# Patient Record
Sex: Female | Born: 1940 | Race: White | Hispanic: No | Marital: Married | Smoking: Never smoker
Health system: Southern US, Community
[De-identification: ages and names within clinical notes are randomized; demographics above are authoritative.]

## PROBLEM LIST (undated history)

## (undated) DIAGNOSIS — M353 Polymyalgia rheumatica: Secondary | ICD-10-CM

## (undated) DIAGNOSIS — M8580 Other specified disorders of bone density and structure, unspecified site: Secondary | ICD-10-CM

## (undated) DIAGNOSIS — M79604 Pain in right leg: Secondary | ICD-10-CM

## (undated) DIAGNOSIS — N952 Postmenopausal atrophic vaginitis: Secondary | ICD-10-CM

## (undated) DIAGNOSIS — J301 Allergic rhinitis due to pollen: Secondary | ICD-10-CM

## (undated) DIAGNOSIS — M797 Fibromyalgia: Secondary | ICD-10-CM

## (undated) DIAGNOSIS — M1990 Unspecified osteoarthritis, unspecified site: Secondary | ICD-10-CM

## (undated) DIAGNOSIS — I10 Essential (primary) hypertension: Secondary | ICD-10-CM

## (undated) DIAGNOSIS — M79605 Pain in left leg: Secondary | ICD-10-CM

## (undated) DIAGNOSIS — E119 Type 2 diabetes mellitus without complications: Secondary | ICD-10-CM

## (undated) DIAGNOSIS — T7840XA Allergy, unspecified, initial encounter: Secondary | ICD-10-CM

## (undated) DIAGNOSIS — R079 Chest pain, unspecified: Secondary | ICD-10-CM

## (undated) DIAGNOSIS — E785 Hyperlipidemia, unspecified: Secondary | ICD-10-CM

## (undated) DIAGNOSIS — C4491 Basal cell carcinoma of skin, unspecified: Secondary | ICD-10-CM

## (undated) DIAGNOSIS — K219 Gastro-esophageal reflux disease without esophagitis: Secondary | ICD-10-CM

## (undated) HISTORY — DX: Chest pain, unspecified: R07.9

## (undated) HISTORY — PX: BREAST BIOPSY: SHX20

## (undated) HISTORY — DX: Allergy, unspecified, initial encounter: T78.40XA

## (undated) HISTORY — DX: Fibromyalgia: M79.7

## (undated) HISTORY — DX: Pain in right leg: M79.604

## (undated) HISTORY — DX: Allergic rhinitis due to pollen: J30.1

## (undated) HISTORY — DX: Basal cell carcinoma of skin, unspecified: C44.91

## (undated) HISTORY — DX: Unspecified osteoarthritis, unspecified site: M19.90

## (undated) HISTORY — DX: Type 2 diabetes mellitus without complications: E11.9

## (undated) HISTORY — DX: Other specified disorders of bone density and structure, unspecified site: M85.80

## (undated) HISTORY — DX: Essential (primary) hypertension: I10

## (undated) HISTORY — DX: Polymyalgia rheumatica: M35.3

## (undated) HISTORY — PX: TOTAL ABDOMINAL HYSTERECTOMY: SHX209

## (undated) HISTORY — DX: Pain in left leg: M79.605

## (undated) HISTORY — DX: Gastro-esophageal reflux disease without esophagitis: K21.9

## (undated) HISTORY — DX: Hyperlipidemia, unspecified: E78.5

## (undated) HISTORY — DX: Postmenopausal atrophic vaginitis: N95.2

---

## 1978-07-02 HISTORY — PX: CHOLECYSTECTOMY: SHX55

## 1998-07-02 DIAGNOSIS — M353 Polymyalgia rheumatica: Secondary | ICD-10-CM

## 1998-07-02 HISTORY — DX: Polymyalgia rheumatica: M35.3

## 2008-09-30 HISTORY — PX: COLONOSCOPY: SHX174

## 2011-08-03 DIAGNOSIS — M8580 Other specified disorders of bone density and structure, unspecified site: Secondary | ICD-10-CM

## 2011-08-03 HISTORY — DX: Other specified disorders of bone density and structure, unspecified site: M85.80

## 2011-12-01 HISTORY — PX: CARDIOVASCULAR STRESS TEST: SHX262

## 2011-12-01 HISTORY — PX: CARDIAC CATHETERIZATION: SHX172

## 2011-12-11 DIAGNOSIS — R079 Chest pain, unspecified: Secondary | ICD-10-CM

## 2011-12-18 ENCOUNTER — Encounter: Payer: Self-pay | Admitting: Cardiology

## 2011-12-19 ENCOUNTER — Encounter: Payer: Self-pay | Admitting: *Deleted

## 2011-12-19 ENCOUNTER — Ambulatory Visit (INDEPENDENT_AMBULATORY_CARE_PROVIDER_SITE_OTHER): Payer: PRIVATE HEALTH INSURANCE | Admitting: Physician Assistant

## 2011-12-19 ENCOUNTER — Encounter: Payer: Self-pay | Admitting: Physician Assistant

## 2011-12-19 ENCOUNTER — Telehealth: Payer: Self-pay

## 2011-12-19 VITALS — BP 104/64 | HR 74 | Ht 61.0 in | Wt 165.4 lb

## 2011-12-19 DIAGNOSIS — R079 Chest pain, unspecified: Secondary | ICD-10-CM

## 2011-12-19 DIAGNOSIS — Z01810 Encounter for preprocedural cardiovascular examination: Secondary | ICD-10-CM

## 2011-12-19 NOTE — Telephone Encounter (Signed)
Auth # [id number] exp 02/02/12

## 2011-12-19 NOTE — Telephone Encounter (Signed)
Left JV cath scheduled for Friday, 6/21 - 11:30 - [place]  CHECKING PERCERT

## 2011-12-19 NOTE — Patient Instructions (Signed)
   Left JV heart cath Friday - see info sheet given  Follow up will be given at time of discharge from above

## 2011-12-19 NOTE — Progress Notes (Signed)
HPI: Patient presents as a referral from Dr. Alton Revere, for evaluation of recurrent exertional chest pain, in the setting of a recent negative exercise nuclear imaging study. She presents with cardiac risk factors notable for HTN, HLD, DM, FH, and age.  Patient has no known history of CAD, and developed new onset exertional chest discomfort approximately one month ago, while doing yard work. This was a particularly intense (7/10) episode, characterized by left upper chest "tightness", with no associated radiation. Since then, she has had recurrent exertional symptoms, even while doing household chores. Symptoms typically resolve with rest after several minutes. She also has noted significant edema only when walking up a flight of stairs.  She was referred for a stress test, performed on June 11 and reviewed by Dr. Diona Browner, which was notable for poor exercise tolerance and no associated chest pain. She exercised for only approximately 3 minutes, with rapid increase in heart rate (102% PMHR). There were no diagnostic EKG changes, occasional PVCs with no sustained arrhythmia, and no perfusion defects suggestive of scar or ischemia; EF 89%.  Patient was seen by Dr. Darius Bump in followup earlier this week, at which time he recommended proceeding with a cardiac catheterization for further evaluation.  EKG in office today, reviewed by me, indicates NSR at 74 bpm with nonspecific ST abnormalities. There are no prior studies for comparison.  Allergies  Allergen Reactions  . Lyrica (Pregabalin)     Current Outpatient Prescriptions  Medication Sig Dispense Refill  . aspirin 81 MG tablet Take 81 mg by mouth daily.      Marland Kitchen CALCIUM PO Take 1,200 mg by mouth daily.       . cholecalciferol (VITAMIN D) 1000 UNITS tablet Take 1,000 Units by mouth daily.      . clopidogrel (PLAVIX) 75 MG tablet Take 75 mg by mouth daily.      Marland Kitchen glucose blood test strip 1 each by Other route as needed. Use as instructed       .  isosorbide mononitrate (IMDUR) 60 MG 24 hr tablet Take 60 mg by mouth daily.      Demetra Sh[employer] (ONE TOUCH SURESOFT) MISC Use when checking blood sugar      . metFORMIN (GLUCOPHAGE) 500 MG tablet Take 500 mg by mouth 2 (two) times daily with a meal. 1 in the morning and 2 in the evening       . metoprolol (LOPRESSOR) 50 MG tablet Take 50 mg by mouth 2 (two) times daily.      . nitroGLYCERIN (NITROSTAT) 0.4 MG SL tablet Place 0.4 mg under the tongue every 5 (five) minutes as needed.      . rosuvastatin (CRESTOR) 40 MG tablet Take 40 mg by mouth daily.      . valsartan-hydrochlorothiazide (DIOVAN-HCT) 160-12.5 MG per tablet Take 1 tablet by mouth daily. Take 1/2 tablet daily         Past Medical History  Diagnosis Date  . Chest pain   . Diabetes mellitus, type 2   . Hypertension   . Fibromyalgia   . Dyslipidemia     No past surgical history on file.  History   Social History  . Marital Status: Married    Spouse Name: N/A    Number of Children: N/A  . Years of Education: N/A   Occupational History  . Not on file.   Social History Main Topics  . Smoking status: Never Smoker   . Smokeless tobacco: Not on file  . Alcohol  Use: Yes     some in her past but not now.   . Drug Use: Not on file  . Sexually Active: Not on file   Other Topics Concern  . Not on file   Social History Narrative  . No narrative on file    No family history on file.  ROS: no nausea, vomiting; no fever, chills; no melena, hematochezia; no claudication  PHYSICAL EXAM: BP 104/64  Pulse 74  Ht 5\' 1"  (1.549 m)  Wt 165 lb 6.4 oz (75.025 kg)  BMI 31.25 kg/m2 GENERAL: 71 year old female, obese, sitting upright; NAD HEENT: NCAT, PERRLA, EOMI; sclera clear; no xanthelasma NECK: palpable bilateral carotid pulses, no bruits; no JVD; no TM LUNGS: CTA bilaterally CARDIAC: RRR (S1, S2); no significant murmurs; no rubs or gallops ABDOMEN: Protuberant EXTREMETIES: Palpable bilateral femoral pulses,  without bruits; intact distal pulses; trace bilateral peripheral edema SKIN: warm/dry; no obvious rash/lesions MUSCULOSKELETAL: no joint deformity NEURO: no focal deficit; NL affect   EKG: reviewed and available in Electronic Records   ASSESSMENT & PLAN:  Chest pain Following discussion with Dr. Myrtis Ser, recommendation is to proceed with diagnostic coronary angiography to exclude significant CAD. Patient presents with new-onset exertional chest discomfort, relieved with rest, in the context of numerous cardiac risk factors, including diabetes. Despite a recent negative exercise nuclear imaging study, characterized by poor exercise tolerance, but with normal perfusion, patient does present with symptoms worrisome for ischemic heart disease. She is in agreement with this recommendation, and the risks/benefits of the procedure were discussed in conjunction with Dr. Myrtis Ser. We will arrange to have this scheduled in our JV catheterization lab in the next 1-2 days. Patient is to continue on current medication regimen, which includes recent addition of Plavix and Imdur. She is also to use prn NTG, as recently instructed.    Gene Mercadez Heitman, PAC

## 2011-12-19 NOTE — Assessment & Plan Note (Signed)
Following discussion with Dr. Myrtis Ser, recommendation is to proceed with diagnostic coronary angiography to exclude significant CAD. Patient presents with new-onset exertional chest discomfort, relieved with rest, in the context of numerous cardiac risk factors, including diabetes. Despite a recent negative exercise nuclear imaging study, characterized by poor exercise tolerance, but with normal perfusion, patient does present with symptoms worrisome for ischemic heart disease. She is in agreement with this recommendation, and the risks/benefits of the procedure were discussed in conjunction with Dr. Myrtis Ser. We will arrange to have this scheduled in our JV catheterization lab in the next 1-2 days. Patient is to continue on current medication regimen, which includes recent addition of Plavix and Imdur. She is also to use prn NTG, as recently instructed.

## 2011-12-20 ENCOUNTER — Encounter (HOSPITAL_COMMUNITY): Payer: Self-pay

## 2011-12-20 ENCOUNTER — Ambulatory Visit (HOSPITAL_COMMUNITY): Admit: 2011-12-20 | Payer: Self-pay | Admitting: Cardiology

## 2011-12-20 SURGERY — LEFT HEART CATHETERIZATION WITH CORONARY ANGIOGRAM
Anesthesia: LOCAL

## 2011-12-21 ENCOUNTER — Inpatient Hospital Stay (HOSPITAL_BASED_OUTPATIENT_CLINIC_OR_DEPARTMENT_OTHER)
Admission: RE | Admit: 2011-12-21 | Discharge: 2011-12-21 | Disposition: A | Discharge status: Home / Self Care | Payer: PRIVATE HEALTH INSURANCE | Source: Ambulatory Visit | Attending: Cardiovascular Disease | Admitting: Cardiovascular Disease

## 2011-12-21 ENCOUNTER — Encounter (HOSPITAL_BASED_OUTPATIENT_CLINIC_OR_DEPARTMENT_OTHER)
Admission: RE | Disposition: A | Discharge status: Home / Self Care | Payer: Self-pay | Source: Ambulatory Visit | Attending: Cardiovascular Disease

## 2011-12-21 DIAGNOSIS — R0609 Other forms of dyspnea: Secondary | ICD-10-CM | POA: Insufficient documentation

## 2011-12-21 DIAGNOSIS — E119 Type 2 diabetes mellitus without complications: Secondary | ICD-10-CM | POA: Insufficient documentation

## 2011-12-21 DIAGNOSIS — IMO0001 Reserved for inherently not codable concepts without codable children: Secondary | ICD-10-CM | POA: Insufficient documentation

## 2011-12-21 DIAGNOSIS — E785 Hyperlipidemia, unspecified: Secondary | ICD-10-CM | POA: Insufficient documentation

## 2011-12-21 DIAGNOSIS — R079 Chest pain, unspecified: Secondary | ICD-10-CM | POA: Insufficient documentation

## 2011-12-21 DIAGNOSIS — I10 Essential (primary) hypertension: Secondary | ICD-10-CM | POA: Insufficient documentation

## 2011-12-21 DIAGNOSIS — R0989 Other specified symptoms and signs involving the circulatory and respiratory systems: Secondary | ICD-10-CM | POA: Insufficient documentation

## 2011-12-21 LAB — POCT I-STAT GLUCOSE: Glucose, Bld: 103 mg/dL — ABNORMAL HIGH (ref 70–99)

## 2011-12-21 SURGERY — JV LEFT HEART CATHETERIZATION WITH CORONARY ANGIOGRAM
Anesthesia: Moderate Sedation

## 2011-12-21 MED ORDER — SODIUM CHLORIDE 0.9 % IJ SOLN: 3.0000 mL | Freq: Two times a day (BID) | INTRAMUSCULAR | Status: DC

## 2011-12-21 MED ORDER — SODIUM CHLORIDE 0.9 % IV SOLN
INTRAVENOUS | Status: DC
  Administered 2011-12-21: 11:00:00 via INTRAVENOUS

## 2011-12-21 MED ORDER — SODIUM CHLORIDE 0.9 % IJ SOLN: 3.0000 mL | INTRAMUSCULAR | Status: DC | PRN

## 2011-12-21 MED ORDER — ONDANSETRON HCL 4 MG/2ML IJ SOLN: 4.0000 mg | Freq: Four times a day (QID) | INTRAMUSCULAR | Status: DC | PRN

## 2011-12-21 MED ORDER — ASPIRIN 81 MG PO CHEW
324.0000 mg | CHEWABLE_TABLET | ORAL | Status: AC
  Administered 2011-12-21: 324 mg via ORAL

## 2011-12-21 MED ORDER — ACETAMINOPHEN 325 MG PO TABS: 650.0000 mg | ORAL_TABLET | ORAL | Status: DC | PRN

## 2011-12-21 MED ORDER — DIAZEPAM 5 MG PO TABS
5.0000 mg | ORAL_TABLET | ORAL | Status: AC
  Administered 2011-12-21: 5 mg via ORAL

## 2011-12-21 MED ORDER — SODIUM CHLORIDE 0.9 % IV SOLN
250.0000 mL | INTRAVENOUS | Status: DC | PRN
Start: 2011-12-21 — End: 2011-12-21

## 2011-12-21 MED ORDER — SODIUM CHLORIDE 0.9 % IV SOLN: INTRAVENOUS | Status: DC

## 2011-12-21 NOTE — CV Procedure (Signed)
    Cardiac Cath Note  Meoshia Billing [id number] May 01, 1941  Procedure: Left  Heart Cardiac Catheterization Note Indications: chest pain, dyspnea   Procedure Details Consent: Obtained Time Out: Verified patient identification, verified procedure, site/side was marked, verified correct patient position, special equipment/implants available, Radiology Safety Procedures followed,  medications/allergies/relevent history reviewed, required imaging and test results available.  Performed   Medications: Fentanyl: 25 mcg IV Versed: 1.5 mg  IV  The right femoral artery was easily canulated using a modified Seldinger technique.  Hemodynamics:    LV pressure: 127/20 Aortic pressure: 117/87  Angiography   Left Main: The left main is smooth and normal.  Left anterior Descending: The left anterior descending artery is smooth and normal. It gives off 2 large diagonal branches which are also normal. The LAD tapers distally and comes in for a small branch but there are no stenosis.  Left Circumflex: The left circumflex artery is moderate in size. The slides a moderate-sized worse acute marginal branch. The continuation branch is extremely small. All these branches are normal.  Right Coronary Artery: The right coronary artery is moderate size and is dominant. It is smooth and normal. The posterior descending artery and the posterior lateral segment arteries are normal.  LV Gram: The left ventricular gram was performed in the 30 RAO position. It reveals normal left ventricle systolic function with an ejection fraction of 65-70%.  Complications: No apparent complications Patient did tolerate procedure well.  Conclusions:   1. Smooth and normal coronary arteries 2. Normal left ventricle systolic function.  Vesta Mixer, Montez Hageman., MD, [facility] 12/21/2011, 11:56 AM Office - [phone] Pager [phone]

## 2011-12-21 NOTE — Progress Notes (Signed)
Bedrest begins @ 1210, tegaderm dressing applied by Army Melia, Dr. Elease Hashimoto in to discuss results with patient and family.

## 2011-12-21 NOTE — Interval H&P Note (Signed)
History and Physical Interval Note:  12/21/2011 11:55 AM  Gabrielle Doyle  has presented today for surgery, with the diagnosis of chest pain  The various methods of treatment have been discussed with the patient and family. After consideration of risks, benefits and other options for treatment, the patient has consented to  Procedure(s) (LRB): JV LEFT HEART CATHETERIZATION WITH CORONARY ANGIOGRAM (N/A) as a surgical intervention .  The patient's history has been reviewed, patient examined, no change in status, stable for surgery.  I have reviewed the patients' chart and labs.  Questions were answered to the patient's satisfaction.     [name].

## 2011-12-21 NOTE — H&P (View-Only) (Signed)
HPI: Patient presents as a referral from Dr. Edward Eller, for evaluation of recurrent exertional chest pain, in the setting of a recent negative exercise nuclear imaging study. She presents with cardiac risk factors notable for HTN, HLD, DM, FH, and age.  Patient has no known history of CAD, and developed new onset exertional chest discomfort approximately one month ago, while doing yard work. This was a particularly intense (7/10) episode, characterized by left upper chest "tightness", with no associated radiation. Since then, she has had recurrent exertional symptoms, even while doing household chores. Symptoms typically resolve with rest after several minutes. She also has noted significant edema only when walking up a flight of stairs.  She was referred for a stress test, performed on June 11 and reviewed by Dr. McDowell, which was notable for poor exercise tolerance and no associated chest pain. She exercised for only approximately 3 minutes, with rapid increase in heart rate (102% PMHR). There were no diagnostic EKG changes, occasional PVCs with no sustained arrhythmia, and no perfusion defects suggestive of scar or ischemia; EF 89%.  Patient was seen by Dr. Eller in followup earlier this week, at which time he recommended proceeding with a cardiac catheterization for further evaluation.  EKG in office today, reviewed by me, indicates NSR at 74 bpm with nonspecific ST abnormalities. There are no prior studies for comparison.  Allergies  Allergen Reactions  . Lyrica (Pregabalin)     Current Outpatient Prescriptions  Medication Sig Dispense Refill  . aspirin 81 MG tablet Take 81 mg by mouth daily.      . CALCIUM PO Take 1,200 mg by mouth daily.       . cholecalciferol (VITAMIN D) 1000 UNITS tablet Take 1,000 Units by mouth daily.      . clopidogrel (PLAVIX) 75 MG tablet Take 75 mg by mouth daily.      . glucose blood test strip 1 each by Other route as needed. Use as instructed       .  isosorbide mononitrate (IMDUR) 60 MG 24 hr tablet Take 60 mg by mouth daily.      . [employer] (ONE TOUCH SURESOFT) MISC Use when checking blood sugar      . metFORMIN (GLUCOPHAGE) 500 MG tablet Take 500 mg by mouth 2 (two) times daily with a meal. 1 in the morning and 2 in the evening       . metoprolol (LOPRESSOR) 50 MG tablet Take 50 mg by mouth 2 (two) times daily.      . nitroGLYCERIN (NITROSTAT) 0.4 MG SL tablet Place 0.4 mg under the tongue every 5 (five) minutes as needed.      . rosuvastatin (CRESTOR) 40 MG tablet Take 40 mg by mouth daily.      . valsartan-hydrochlorothiazide (DIOVAN-HCT) 160-12.5 MG per tablet Take 1 tablet by mouth daily. Take 1/2 tablet daily         Past Medical History  Diagnosis Date  . Chest pain   . Diabetes mellitus, type 2   . Hypertension   . Fibromyalgia   . Dyslipidemia     No past surgical history on file.  History   Social History  . Marital Status: Married    Spouse Name: N/A    Number of Children: N/A  . Years of Education: N/A   Occupational History  . Not on file.   Social History Main Topics  . Smoking status: Never Smoker   . Smokeless tobacco: Not on file  . Alcohol   Use: Yes     some in her past but not now.   . Drug Use: Not on file  . Sexually Active: Not on file   Other Topics Concern  . Not on file   Social History Narrative  . No narrative on file    No family history on file.  ROS: no nausea, vomiting; no fever, chills; no melena, hematochezia; no claudication  PHYSICAL EXAM: BP 104/64  Pulse 74  Ht 5' 1" (1.549 m)  Wt 165 lb 6.4 oz (75.025 kg)  BMI 31.25 kg/m2 GENERAL: 71-year-old female, obese, sitting upright; NAD HEENT: NCAT, PERRLA, EOMI; sclera clear; no xanthelasma NECK: palpable bilateral carotid pulses, no bruits; no JVD; no TM LUNGS: CTA bilaterally CARDIAC: RRR (S1, S2); no significant murmurs; no rubs or gallops ABDOMEN: Protuberant EXTREMETIES: Palpable bilateral femoral pulses,  without bruits; intact distal pulses; trace bilateral peripheral edema SKIN: warm/dry; no obvious rash/lesions MUSCULOSKELETAL: no joint deformity NEURO: no focal deficit; NL affect   EKG: reviewed and available in Electronic Records   ASSESSMENT & PLAN:  Chest pain Following discussion with Dr. Katz, recommendation is to proceed with diagnostic coronary angiography to exclude significant CAD. Patient presents with new-onset exertional chest discomfort, relieved with rest, in the context of numerous cardiac risk factors, including diabetes. Despite a recent negative exercise nuclear imaging study, characterized by poor exercise tolerance, but with normal perfusion, patient does present with symptoms worrisome for ischemic heart disease. She is in agreement with this recommendation, and the risks/benefits of the procedure were discussed in conjunction with Dr. Katz. We will arrange to have this scheduled in our JV catheterization lab in the next 1-2 days. Patient is to continue on current medication regimen, which includes recent addition of Plavix and Imdur. She is also to use prn NTG, as recently instructed.    Gabrielle Doyle, PAC  

## 2011-12-31 DIAGNOSIS — C4491 Basal cell carcinoma of skin, unspecified: Secondary | ICD-10-CM

## 2011-12-31 HISTORY — DX: Basal cell carcinoma of skin, unspecified: C44.91

## 2012-01-10 ENCOUNTER — Encounter: Payer: PRIVATE HEALTH INSURANCE | Admitting: Physician Assistant

## 2012-04-07 ENCOUNTER — Encounter: Payer: Self-pay | Admitting: Family Medicine

## 2012-04-07 ENCOUNTER — Ambulatory Visit (INDEPENDENT_AMBULATORY_CARE_PROVIDER_SITE_OTHER): Payer: PRIVATE HEALTH INSURANCE | Admitting: Family Medicine

## 2012-04-07 VITALS — BP 118/71 | HR 78 | Temp 97.0°F | Ht 61.0 in | Wt 157.0 lb

## 2012-04-07 DIAGNOSIS — E785 Hyperlipidemia, unspecified: Secondary | ICD-10-CM

## 2012-04-07 DIAGNOSIS — Z23 Encounter for immunization: Secondary | ICD-10-CM

## 2012-04-07 DIAGNOSIS — I10 Essential (primary) hypertension: Secondary | ICD-10-CM | POA: Insufficient documentation

## 2012-04-07 DIAGNOSIS — E119 Type 2 diabetes mellitus without complications: Secondary | ICD-10-CM

## 2012-04-07 HISTORY — DX: Hyperlipidemia, unspecified: E78.5

## 2012-04-07 HISTORY — DX: Type 2 diabetes mellitus without complications: E11.9

## 2012-04-07 HISTORY — DX: Essential (primary) hypertension: I10

## 2012-04-07 LAB — COMPREHENSIVE METABOLIC PANEL
AST: 29 U/L (ref 0–37)
Albumin: 3.7 g/dL (ref 3.5–5.2)
Alkaline Phosphatase: 33 U/L — ABNORMAL LOW (ref 39–117)
BUN: 9 mg/dL (ref 6–23)
Glucose, Bld: 116 mg/dL — ABNORMAL HIGH (ref 70–99)
Potassium: 4 mEq/L (ref 3.5–5.1)
Sodium: 137 mEq/L (ref 135–145)
Total Bilirubin: 0.8 mg/dL (ref 0.3–1.2)
Total Protein: 7 g/dL (ref 6.0–8.3)

## 2012-04-07 MED ORDER — ZOSTER VACCINE LIVE 19400 UNT/0.65ML ~~LOC~~ SOLR: 0.6500 mL | Freq: Once | SUBCUTANEOUS | Status: DC

## 2012-04-07 NOTE — Assessment & Plan Note (Signed)
Problem stable.  Continue current medications and diet appropriate for this condition.  We have reviewed our general long term plan for this problem and also reviewed symptoms and signs that should prompt the patient to call or return to the office. CMET today. 

## 2012-04-07 NOTE — Assessment & Plan Note (Signed)
Problem stable.  Continue current medications and diet appropriate for this condition.  We have reviewed our general long term plan for this problem and also reviewed symptoms and signs that should prompt the patient to call or return to the office. HbA1c today. Pneumovax today.   Zostavax rx given to pt today.

## 2012-04-07 NOTE — Progress Notes (Signed)
Office Note 04/07/2012  CC:  Chief Complaint  Patient presents with  . Establish Care    no problems; transfer from Dr. Pam Drown    HPI:  Gabrielle Doyle is a 71 y.o. White female who is here to establish care. [name] most recent primary MD: Dr. Alton Revere, [place], [place]. Old records were not reviewed prior to or during today's visit.  Lives in [place] now, old MD went out of Network with her new insurance.  No complaints, feels well. Last lab work was about 4-6 mo ago and she doesn't recall exactly what was done at that time. All GYN f/u was done by her PCP: no hx of abnormal paps plus has hx of hysterectomy for nonmalignant reasons.  Bone density and mammogram both done this year and were normal.  Last diabetic eye exam 12/2011. Last colonoscopy was about 5 yrs ago--no abnormalities.  Flu shot was 04/02/12.  Tetanus booster approx 2011.  Past Medical History  Diagnosis Date  . Chest pain     cath clean 2013.  Presumed esophageal pain.  . Diabetes mellitus, type 2   . Hypertension   . Leg pain, bilateral     Questionable diagnosis of fibromyalgia in past (per pt); some vague leg pains, lyrica was tried and gave her side effect so this dx  consideration has really been dropped.  . Dyslipidemia   . Hay fever     seasonal--fall esp  . Osteoarthritis     hands, light--"legs" nonspecific--tylenol is sufficient for her.  . Polymyalgia rheumatica 2000    took prednisone for about 2 yrs.    Past Surgical History  Procedure Date  . Cardiac catheterization 12/2011    Smooth coronaries and normal LV fxn.  . Cholecystectomy 1980  . Total abdominal hysterectomy 1970s    non-malignant reasons; metromenorrhagia    Family History  Problem Relation Age of Onset  . Hyperlipidemia Mother   . Stroke Mother   . Heart disease Mother   . Hypertension Mother   . Diabetes Mother   . Hyperlipidemia Father   . Heart disease Father   . Hypertension Father   . Diabetes  Sister   . Diabetes Brother     History   Social History  . Marital Status: Married    Spouse Name: N/A    Number of Children: N/A  . Years of Education: N/A   Occupational History  . Not on file.   Social History Main Topics  . Smoking status: Never Smoker   . Smokeless tobacco: Never Used  . Alcohol Use: Yes     some in her past but not now.   . Drug Use: No  . Sexually Active: Not on file   Other Topics Concern  . Not on file   Social History Narrative   Married, 3 children living and around this region.Orig from [place], [place].Retired from [employer].  Level of education 12 grade.No T/A/Ds.Exercise: walks, works in yard.    Outpatient Encounter Prescriptions as of 04/07/2012  Medication Sig Dispense Refill  . aspirin 81 MG tablet Take 81 mg by mouth daily.      Marland Kitchen CALCIUM PO Take 1,200 mg by mouth daily.       . cholecalciferol (VITAMIN D) 1000 UNITS tablet Take 1,000 Units by mouth daily.      Marland Kitchen glucose blood test strip 1 each by Other route as needed. Use as instructed       . [employer] (ONE TOUCH SURESOFT) MISC  Use when checking blood sugar      . metFORMIN (GLUCOPHAGE) 500 MG tablet Take 500 mg by mouth 2 (two) times daily with a meal. 1 in the morning and 2 in the evening       . nitroGLYCERIN (NITROSTAT) 0.4 MG SL tablet Place 0.4 mg under the tongue every 5 (five) minutes as needed.      . rosuvastatin (CRESTOR) 40 MG tablet Take 40 mg by mouth daily.      . valsartan-hydrochlorothiazide (DIOVAN-HCT) 160-12.5 MG per tablet Take 1 tablet by mouth daily. Take 1/2 tablet daily       . zoster vaccine live, PF, (ZOSTAVAX) 04540 UNT/0.65ML injection Inject 19,400 Units into the skin once.  1 vial  0  . DISCONTD: clopidogrel (PLAVIX) 75 MG tablet Take 75 mg by mouth daily.      Marland Kitchen DISCONTD: isosorbide mononitrate (IMDUR) 60 MG 24 hr tablet Take 60 mg by mouth daily.      Marland Kitchen DISCONTD: metoprolol (LOPRESSOR) 50 MG tablet Take 50 mg by mouth 2 (two) times daily.         Allergies  Allergen Reactions  . Lyrica (Pregabalin) Other (See Comments)    Almost caused a stroke    ROS Review of Systems  Constitutional: Negative for fever and fatigue.  HENT: Negative for congestion and sore throat.   Eyes: Negative for visual disturbance.  Respiratory: Negative for cough.   Cardiovascular: Negative for chest pain.  Gastrointestinal: Negative for nausea and abdominal pain.  Genitourinary: Negative for dysuria.  Musculoskeletal: Negative for back pain and joint swelling.  Skin: Negative for rash.  Neurological: Negative for weakness and headaches.  Hematological: Negative for adenopathy.    PE; Blood pressure 118/71, pulse 78, temperature 97 F (36.1 C), temperature source Temporal, height 5\' 1"  (1.549 m), weight 157 lb (71.215 kg), SpO2 98.00%. Gen: Alert, well appearing.  Patient is oriented to person, place, time, and situation. ENT:   Eyes: no injection, icteris, swelling, or exudate.  EOMI, PERRLA. Nose: no drainage or turbinate edema/swelling.  No injection or focal lesion.  Mouth: lips without lesion/swelling.  Oral mucosa pink and moist.   Oropharynx without erythema, exudate, or swelling.  Neck - No masses or thyromegaly or limitation in range of motion CV: RRR, no m/r/g.   LUNGS: CTA bilat, nonlabored resps, good aeration in all lung fields. ABD: soft, NT, ND, BS normal.  No hepatospenomegaly or mass.  No bruits. EXT: no clubbing, cyanosis, or edema.  Labs: none today  ASSESSMENT AND PLAN:   New Pt: obtain old records.  HTN (hypertension), benign Problem stable.  Continue current medications and diet appropriate for this condition.  We have reviewed our general long term plan for this problem and also reviewed symptoms and signs that should prompt the patient to call or return to the office. CMET today.  Type II or unspecified type diabetes mellitus without mention of complication, not stated as uncontrolled Problem stable.  Continue  current medications and diet appropriate for this condition.  We have reviewed our general long term plan for this problem and also reviewed symptoms and signs that should prompt the patient to call or return to the office. HbA1c today. Pneumovax today.   Zostavax rx given to pt today.  Hyperlipidemia Problem stable.  Continue current medications and diet appropriate for this condition.  We have reviewed our general long term plan for this problem and also reviewed symptoms and signs that should prompt the patient to call  or return to the office.    An After Visit Summary was printed and given to the patient.  Return in about 4 months (around 08/08/2012) for f/u DM 2, hyperlip, HTN.

## 2012-04-07 NOTE — Assessment & Plan Note (Signed)
Problem stable.  Continue current medications and diet appropriate for this condition.  We have reviewed our general long term plan for this problem and also reviewed symptoms and signs that should prompt the patient to call or return to the office.  

## 2012-07-31 ENCOUNTER — Encounter: Payer: Self-pay | Admitting: Family Medicine

## 2012-08-05 ENCOUNTER — Encounter: Payer: Self-pay | Admitting: Family Medicine

## 2012-08-05 ENCOUNTER — Ambulatory Visit (INDEPENDENT_AMBULATORY_CARE_PROVIDER_SITE_OTHER): Payer: Medicare Other | Admitting: Family Medicine

## 2012-08-05 VITALS — BP 125/75 | HR 72 | Temp 97.6°F | Ht 61.0 in | Wt 161.0 lb

## 2012-08-05 DIAGNOSIS — N952 Postmenopausal atrophic vaginitis: Secondary | ICD-10-CM

## 2012-08-05 DIAGNOSIS — E119 Type 2 diabetes mellitus without complications: Secondary | ICD-10-CM

## 2012-08-05 DIAGNOSIS — E785 Hyperlipidemia, unspecified: Secondary | ICD-10-CM

## 2012-08-05 HISTORY — DX: Postmenopausal atrophic vaginitis: N95.2

## 2012-08-05 LAB — LIPID PANEL
Cholesterol: 131 mg/dL (ref 0–200)
LDL Cholesterol: 55 mg/dL (ref 0–99)
Total CHOL/HDL Ratio: 3
Triglycerides: 171 mg/dL — ABNORMAL HIGH (ref 0.0–149.0)
VLDL: 34.2 mg/dL (ref 0.0–40.0)

## 2012-08-05 MED ORDER — ESTROGENS, CONJUGATED 0.625 MG/GM VA CREA: TOPICAL_CREAM | Freq: Every day | VAGINAL | Status: DC

## 2012-08-05 MED ORDER — VALSARTAN-HYDROCHLOROTHIAZIDE 160-12.5 MG PO TABS: 0.5000 | ORAL_TABLET | Freq: Every day | ORAL | Status: DC

## 2012-08-05 MED ORDER — ROSUVASTATIN CALCIUM 40 MG PO TABS: 20.0000 mg | ORAL_TABLET | Freq: Every day | ORAL | Status: DC

## 2012-08-05 MED ORDER — METFORMIN HCL 500 MG PO TABS: ORAL_TABLET | ORAL | Status: DC

## 2012-08-05 MED ORDER — GLUCOSE BLOOD VI STRP: ORAL_STRIP | Status: DC

## 2012-08-05 NOTE — Progress Notes (Signed)
OFFICE NOTE  08/05/2012  CC:  Chief Complaint  Patient presents with  . Follow-up    DM, lipids, HTN    HPI: Patient is a 72 y.o. Caucasian female who is here for 4 mo f/u DM 2, hyperlipidemia, and HTN.   Feeling good except some chronic low back, hips, and leg pains that she wants to make a future appointment to address. Glucoses 90s-110 fasting.  Not checking any other time of day. No bp checks to report.   No med problems.  Reports vag sx's: describes irritated clitoris lately.  Never had this before.  Describes hx of blister ?  A few months ago she says this "popped"--serosanguinous type fluid came out per her description, has hurt some ever since and also has slight pulling/tearing sensation deep in vagina with intercourse.  Pertinent PMH:  Past Medical History  Diagnosis Date  . Chest pain     cath clean 2013.  Presumed esophageal pain.  . Diabetes mellitus, type 2   . Hypertension   . Leg pain, bilateral     Questionable diagnosis of fibromyalgia in past (per pt); some vague leg pains, lyrica was tried and gave her side effect so this dx  consideration has really been dropped.  . Dyslipidemia   . Hay fever     seasonal--fall esp  . Osteoarthritis     hands, light--"legs" nonspecific--tylenol is sufficient for her.  . Polymyalgia rheumatica 2000    took prednisone for about 2 yrs.  Marland Kitchen GERD (gastroesophageal reflux disease)   . Osteopenia 08/2011    T score avg 2  . Basal cell carcinoma 12/2011    Left shoulder    MEDS:  Outpatient Prescriptions Prior to Visit  Medication Sig Dispense Refill  . aspirin 81 MG tablet Take 81 mg by mouth daily.      Marland Kitchen CALCIUM PO Take 1,200 mg by mouth daily.       . cholecalciferol (VITAMIN D) 1000 UNITS tablet Take 2,000 Units by mouth daily.       Demetra Shiner Devices (ONE TOUCH SURESOFT) MISC Use when checking blood sugar      . nitroGLYCERIN (NITROSTAT) 0.4 MG SL tablet Place 0.4 mg under the tongue every 5 (five) minutes as needed.       . [DISCONTINUED] glucose blood test strip 1 each by Other route as needed. Use as instructed       . [DISCONTINUED] metFORMIN (GLUCOPHAGE) 500 MG tablet 1 in the morning and 2 in the evening      . [DISCONTINUED] rosuvastatin (CRESTOR) 40 MG tablet Take 20 mg by mouth daily.       . [DISCONTINUED] valsartan-hydrochlorothiazide (DIOVAN-HCT) 160-12.5 MG per tablet Take 0.5 tablets by mouth daily.       . [DISCONTINUED] zoster vaccine live, PF, (ZOSTAVAX) 40981 UNT/0.65ML injection Inject 19,400 Units into the skin once.  1 vial  0  Last reviewed on 08/05/2012  8:32 AM by Jeoffrey Massed, MD  PE: Blood pressure 125/75, pulse 72, temperature 97.6 F (36.4 C), temperature source Temporal, height 5\' 1"  (1.549 m), weight 161 lb (73.029 kg), SpO2 100.00%. Patient examined with CMA Francee Piccolo as chaperone. Gen: Alert, well appearing.  Patient is oriented to person, place, time, and situation. Vaginal exam: no focal mucosal lesions.  Generalized atrophy of vaginal mucosa is noted.  Urethral meatus appears normal.  No vaginal discharge.  No tenderness or swelling to suggest bartholin's gland cyst or abscess. No rash.  IMPRESSION AND PLAN:  Type II or unspecified type diabetes mellitus without mention of complication, not stated as uncontrolled Stable per home glucose reports. Check HbA1c today.  Needs annual foot exam next routine diabetic f/u visit.  Will also get her to local eye MD for diab retinopathy screening exam. Continue current med/dosing.  HTN (hypertension), benign Problem stable.  Continue current medications and diet appropriate for this condition.  We have reviewed our general long term plan for this problem and also reviewed symptoms and signs that should prompt the patient to call or return to the office. Lytes/cr normal 28mo ago. Will not check these today.  Hyperlipidemia Due for FLP today. Continue statin. AST/ALT normal 4 mo ago.  Postmenopause atrophic  vaginitis Start estrogen cream; apply vaginally daily.   An After Visit Summary was printed and given to the patient.  FOLLOW UP: 28mo

## 2012-08-05 NOTE — Assessment & Plan Note (Addendum)
Due for FLP today. Continue statin. AST/ALT normal 4 mo ago.

## 2012-08-05 NOTE — Assessment & Plan Note (Addendum)
Stable per home glucose reports. Check HbA1c today.  Needs annual foot exam next routine diabetic f/u visit.  Will also get her to local eye MD for diab retinopathy screening exam. Continue current med/dosing.

## 2012-08-05 NOTE — Assessment & Plan Note (Signed)
Start estrogen cream; apply vaginally daily.

## 2012-08-05 NOTE — Assessment & Plan Note (Signed)
Problem stable.  Continue current medications and diet appropriate for this condition.  We have reviewed our general long term plan for this problem and also reviewed symptoms and signs that should prompt the patient to call or return to the office. Lytes/cr normal 22mo ago. Will not check these today.

## 2012-12-03 ENCOUNTER — Ambulatory Visit: Payer: Medicare Other | Admitting: Family Medicine

## 2013-01-08 ENCOUNTER — Other Ambulatory Visit: Payer: Self-pay

## 2014-01-11 DIAGNOSIS — E1165 Type 2 diabetes mellitus with hyperglycemia: Secondary | ICD-10-CM | POA: Insufficient documentation

## 2014-01-11 DIAGNOSIS — E119 Type 2 diabetes mellitus without complications: Secondary | ICD-10-CM | POA: Insufficient documentation

## 2014-01-11 DIAGNOSIS — I499 Cardiac arrhythmia, unspecified: Secondary | ICD-10-CM | POA: Insufficient documentation

## 2014-07-13 DIAGNOSIS — Z23 Encounter for immunization: Secondary | ICD-10-CM | POA: Insufficient documentation

## 2014-07-14 DIAGNOSIS — N952 Postmenopausal atrophic vaginitis: Secondary | ICD-10-CM | POA: Insufficient documentation

## 2014-07-14 DIAGNOSIS — N816 Rectocele: Secondary | ICD-10-CM | POA: Insufficient documentation

## 2015-01-12 DIAGNOSIS — M25541 Pain in joints of right hand: Secondary | ICD-10-CM | POA: Insufficient documentation

## 2015-05-17 DIAGNOSIS — Z79899 Other long term (current) drug therapy: Secondary | ICD-10-CM | POA: Insufficient documentation

## 2015-05-17 DIAGNOSIS — R531 Weakness: Secondary | ICD-10-CM | POA: Insufficient documentation

## 2015-08-09 DIAGNOSIS — Z1231 Encounter for screening mammogram for malignant neoplasm of breast: Secondary | ICD-10-CM | POA: Insufficient documentation

## 2016-01-31 DIAGNOSIS — M25551 Pain in right hip: Secondary | ICD-10-CM | POA: Insufficient documentation

## 2016-01-31 DIAGNOSIS — G8929 Other chronic pain: Secondary | ICD-10-CM | POA: Insufficient documentation

## 2016-03-08 DIAGNOSIS — H2513 Age-related nuclear cataract, bilateral: Secondary | ICD-10-CM | POA: Insufficient documentation

## 2016-03-08 DIAGNOSIS — H524 Presbyopia: Secondary | ICD-10-CM | POA: Insufficient documentation

## 2016-03-08 DIAGNOSIS — H5203 Hypermetropia, bilateral: Secondary | ICD-10-CM | POA: Insufficient documentation

## 2016-03-08 DIAGNOSIS — H40033 Anatomical narrow angle, bilateral: Secondary | ICD-10-CM | POA: Insufficient documentation

## 2016-03-08 DIAGNOSIS — H02831 Dermatochalasis of right upper eyelid: Secondary | ICD-10-CM | POA: Insufficient documentation

## 2016-03-08 DIAGNOSIS — H52223 Regular astigmatism, bilateral: Secondary | ICD-10-CM | POA: Insufficient documentation

## 2016-07-09 DIAGNOSIS — R252 Cramp and spasm: Secondary | ICD-10-CM | POA: Insufficient documentation

## 2016-08-21 DIAGNOSIS — R42 Dizziness and giddiness: Secondary | ICD-10-CM | POA: Insufficient documentation

## 2019-11-11 ENCOUNTER — Telehealth: Payer: Self-pay

## 2019-11-11 NOTE — Telephone Encounter (Signed)
NOTES ON FILE FROM [facility] [phone], SENT REFERRAL TO SCHEDULING

## 2019-11-12 ENCOUNTER — Telehealth: Payer: Self-pay | Admitting: Interventional Cardiology

## 2019-11-12 NOTE — Telephone Encounter (Signed)
Left message to call back  

## 2019-11-12 NOTE — Telephone Encounter (Signed)
New message  Patient is wanting to get husband approved to accompany her to her appointment due to her SOB. Please give patient a call back to confirm.

## 2019-11-13 NOTE — Telephone Encounter (Signed)
Follow up  Pt is returning call, pt said if she did not able to answer the phone to leave her detailed message

## 2019-11-13 NOTE — Telephone Encounter (Signed)
I spoke to the patient and explained that we are still taking precautions with CoVid and not allowing accompanying family members with the patient, unless warranted.  She verbalized understanding.

## 2019-11-23 ENCOUNTER — Encounter: Payer: Self-pay | Admitting: Interventional Cardiology

## 2019-11-23 ENCOUNTER — Ambulatory Visit: Payer: Medicare HMO | Admitting: Interventional Cardiology

## 2019-11-23 ENCOUNTER — Other Ambulatory Visit: Payer: Self-pay

## 2019-11-23 VITALS — BP 124/60 | HR 80 | Ht 61.0 in | Wt 150.8 lb

## 2019-11-23 DIAGNOSIS — R0609 Other forms of dyspnea: Secondary | ICD-10-CM

## 2019-11-23 DIAGNOSIS — E782 Mixed hyperlipidemia: Secondary | ICD-10-CM | POA: Diagnosis not present

## 2019-11-23 DIAGNOSIS — R0600 Dyspnea, unspecified: Secondary | ICD-10-CM | POA: Diagnosis not present

## 2019-11-23 DIAGNOSIS — E119 Type 2 diabetes mellitus without complications: Secondary | ICD-10-CM | POA: Diagnosis not present

## 2019-11-23 DIAGNOSIS — R0789 Other chest pain: Secondary | ICD-10-CM | POA: Diagnosis not present

## 2019-11-23 MED ORDER — METOPROLOL TARTRATE 50 MG PO TABS: ORAL_TABLET | ORAL | 0 refills | Status: DC

## 2019-11-23 NOTE — Progress Notes (Signed)
Cardiology Office Note   Date:  11/23/2019   ID:  Gabrielle Doyle, DOB 1940/11/18, MRN [id number]  PCP:  Gabrielle Peers, Gabrielle Doyle    No chief complaint on file.  Shortness of breath  Wt Readings from Last 3 Encounters:  11/23/19 150 lb 12.8 oz (68.4 kg)  08/05/12 161 lb (73 kg)  04/07/12 157 lb (71.2 kg)       History of Present Illness: Gabrielle Doyle is a 79 y.o. female who is being seen today for the evaluation of DOE at the request of Gabrielle Doyle, Gabrielle Doyle, Gabrielle Doyle.  She had an episode of DOE when pushing a wheel barrow; the wheel was flat and she had to push extra hard.  There was some pressure in the chest but she was determined to get her tasks done.  She had to sit down immediately after.  Her husband felt that she did not look healthy.  SHe was Gabrielle Doyle and her husband "made her go to the doctor."  Since then, she has felt well.  She does walk about once a week on flat surfaces.  She does not walk up hills much.    I looked back in the record and she did have a stress test back in 2013.  She was not able to complete stage one of the Bruce protocol.  Cath was done in 2013 showing: "Left Main: The left main is smooth and normal.  Left anterior Descending: The left anterior descending artery is smooth and normal. It gives off 2 large diagonal branches which are also normal. The LAD tapers distally and comes in for a small branch but there are no stenosis.  Left Circumflex: The left circumflex artery is moderate in size. The slides a moderate-sized worse acute marginal branch. The continuation branch is extremely small. All these branches are normal.  Right Coronary Artery: The right coronary artery is moderate size and is dominant. It is smooth and normal. The posterior descending artery and the posterior lateral segment arteries are normal.  LV Gram: The left ventricular gram was performed in the 30 RAO position. It reveals normal left ventricle systolic function with an  ejection fraction of 65-70%.  Complications: No apparent complications Patient did tolerate procedure well.  Conclusions:   1. Smooth and normal coronary arteries 2. Normal left ventricle systolic function."  Denies :  Dizziness. Leg edema. Nitroglycerin use. Orthopnea. Palpitations. Paroxysmal nocturnal dyspnea.  Syncope.    Past Medical History:  Diagnosis Date  . Basal cell carcinoma 12/2011   Left shoulder  . Chest pain    cath clean 2013.  Presumed esophageal pain.  . Diabetes mellitus, type 2 (Gabrielle Doyle)   . Dyslipidemia   . GERD (gastroesophageal reflux disease)   . Hay fever    seasonal--fall esp  . HTN (hypertension), benign 04/07/2012  . Hyperlipidemia 04/07/2012  . Hypertension   . Leg pain, bilateral    Questionable diagnosis of fibromyalgia in past (per pt); some vague leg pains, lyrica was tried and gave her side effect so this dx  consideration has really been dropped.  . Osteoarthritis    hands, light--"legs" nonspecific--tylenol is sufficient for her.  . Osteopenia 08/2011   T score avg 2  . Polymyalgia rheumatica (Gabrielle Doyle) 2000   took prednisone for about 2 yrs.  Marland Kitchen Postmenopausal atrophic vaginitis   . Postmenopause atrophic vaginitis 08/05/2012  . Type II or unspecified type diabetes mellitus without mention of complication, not stated as uncontrolled 04/07/2012  Past Surgical History:  Procedure Laterality Date  . BREAST BIOPSY     x 2, left  . CARDIAC CATHETERIZATION  12/2011   Smooth coronaries and normal LV fxn.  Marland Kitchen CARDIOVASCULAR STRESS TEST  12/2011   No ischemia; hypertensive/tachycardic response.  Normal LV  syst fxn, 88%. EF.  Marland Kitchen CHOLECYSTECTOMY  1980  . COLONOSCOPY  09/2008   normal (Dr. Lindalou Hose, Gabrielle Doyle),  Repeat TCS 2020.   Marland Kitchen TOTAL ABDOMINAL HYSTERECTOMY  1970s   non-malignant reasons; metromenorrhagia     Current Outpatient Medications  Medication Sig Dispense Refill  . aspirin 81 MG tablet Take 81 mg by mouth daily.    Marland Kitchen CALCIUM PO Take 1,200 mg  by mouth daily.     . cholecalciferol (VITAMIN D) 1000 UNITS tablet Take 2,000 Units by mouth daily.     Marland Kitchen glucose blood (BAYER CONTOUR TEST) test strip Use as instructed. DX 250.00 100 each 5  . glucose blood test strip Dispense Ultilet Lancet.  Use as directed.  DX 250.00 100 each 5  . Lancet Devices (ONE TOUCH SURESOFT) MISC Use when checking blood sugar    . losartan (COZAAR) 50 MG tablet Take 50 mg by mouth daily.    . metFORMIN (GLUCOPHAGE) 500 MG tablet 1 in the morning and 2 in the evening 90 tablet 5  . Multiple Vitamin (MULTIVITAMIN) capsule Take by mouth.    . nitroGLYCERIN (NITROSTAT) 0.4 MG SL tablet Place 0.4 mg under the tongue every 5 (five) minutes as needed.    . rosuvastatin (CRESTOR) 40 MG tablet Take 0.5 tablets (20 mg total) by mouth daily. 30 tablet 5  . metoprolol tartrate (LOPRESSOR) 50 MG tablet Take 1 tablet 2 hours prior to Cardiac CT 1 tablet 0   No current facility-administered medications for this visit.    Allergies:   Celecoxib, Lisinopril, and Lyrica [pregabalin]    Social History:  The patient  reports that she has never smoked. She has never used smokeless tobacco. She reports current alcohol use. She reports that she does not use drugs.   Family History:  The patient's family history includes Diabetes in her brother, mother, and sister; Heart disease in her father and mother; Hyperlipidemia in her father and mother; Hypertension in her father and mother; Stroke in her mother.    ROS:  Please see the history of present illness.   Otherwise, review of systems are positive for episode of dyspnea as noted above.   All other systems are reviewed and negative.    PHYSICAL EXAM: VS:  BP 124/60   Pulse 80   Ht 5\' 1"  (1.549 Doyle)   Wt 150 lb 12.8 oz (68.4 kg)   SpO2 95%   BMI 28.49 kg/Doyle  , BMI Body mass index is 28.49 kg/Doyle. GEN: Well nourished, well developed, in no acute distress  HEENT: normal  Neck: no JVD, carotid bruits, or masses Cardiac: RRR; no  murmurs, rubs, or gallops,no edema  Respiratory:  clear to auscultation bilaterally, normal work of breathing GI: soft, nontender, nondistended, + BS MS: no deformity or atrophy  Skin: warm and dry, no rash Neuro:  Strength and sensation are intact Psych: euthymic mood, full affect   EKG:   The ekg ordered today demonstrates normal sinus rhythm, no ST segment changes   Recent Labs: No results found for requested labs within last 8760 hours.   Lipid Panel    Component Value Date/Time   CHOL 131 08/05/2012 0857   TRIG 171.0 (H) 08/05/2012  [id number]   HDL 41.50 08/05/2012 0857   CHOLHDL 3 08/05/2012 0857   VLDL 34.2 08/05/2012 0857   LDLCALC 55 08/05/2012 0857     Other studies Reviewed: Additional studies/ records that were reviewed today with results demonstrating: Abnormal ECG noted in primary care doctor's note, but I am unable to pull up the tracing.   ASSESSMENT AND PLAN:  1. DOE/chest pressure: Plan for coronary CTA to evaluate for significant coronary artery disease.  She has a below average exercise tolerance as of 2013.  Based on her level of activity at this time, I doubt this is improved.  Given her multiple risk factors including diabetes, hypertension and hyperlipidemia, I think she does need an evaluation. 2. Hyperlipidemia: Continue high-dose rosuvastatin. 3. Diabetes: Continue Glucophage. 4. Hypertension: Well controlled today.  She previously had been on valsartan/HCTZ but it appears this has been held.   Current medicines are reviewed at length with the patient today.  The patient concerns regarding her medicines were addressed.  The following changes have been made:  No change  Labs/ tests ordered today include:   Orders Placed This Encounter  Procedures  . CT CORONARY MORPH W/CTA COR W/SCORE W/CA W/CM &/OR WO/CM  . CT CORONARY FRACTIONAL FLOW RESERVE DATA PREP  . CT CORONARY FRACTIONAL FLOW RESERVE FLUID ANALYSIS  . Basic metabolic panel  . EKG 12-Lead      Recommend 150 minutes/week of aerobic exercise Low fat, low carb, high fiber diet recommended  Disposition:   FU for cardiac CT   Signed, Larae Grooms, Gabrielle Doyle  11/23/2019 4:20 PM    [facility] [address], [place], [place]  [address] Phone: [phone]; Fax: [phone]

## 2019-11-23 NOTE — Patient Instructions (Addendum)
Medication Instructions:  Your physician recommends that you continue on your current medications as directed. Please refer to the Current Medication list given to you today.  *If you need a refill on your cardiac medications before your next appointment, please call your pharmacy*   Lab Work: None today  If you have labs (blood work) drawn today and your tests are completely normal, you will receive your results only by: Marland Kitchen MyChart Message (if you have MyChart) OR . A paper copy in the mail If you have any lab test that is abnormal or we need to change your treatment, we will call you to review the results.   Testing/Procedures: Your physician has requested that you have cardiac CT. Cardiac computed tomography (CT) is a painless test that uses an x-ray machine to take clear, detailed pictures of your heart. For further information please visit [email or web address][facility][email or web address]. Please follow instruction sheet as given.   Follow-Up: Based on results  Your cardiac CT will be scheduled at one of the below locations:   [facility] [address] [place], [place] [address] [phone]  [place] [address] [place], [place] [address] [phone]  If scheduled at [facility], please arrive at the [facility] main entrance of [facility] 30 minutes prior to test start time. Proceed to the [facility] Radiology Department (first floor) to check-in and test prep.  If scheduled at [facility], please arrive 15 mins early for check-in and test prep.  Please follow these instructions carefully (unless otherwise directed):   On the Night Before the Test: . Be sure to Drink plenty of water. . Do not consume any caffeinated/decaffeinated beverages or chocolate 12 hours prior to your test. . Do not take any antihistamines 12 hours prior to your test. . If you take Metformin do not take 24  hours prior to test.   On the Day of the Test: . Drink plenty of water. Do not drink any water within one hour of the test. . Do not eat any food 4 hours prior to the test. . You may take your regular medications prior to the test.  . Take metoprolol (Lopressor) 50 MG two hours prior to test. . HOLD metformin morning of the test. . FEMALES- please wear underwire-free bra if available       After the Test: . Drink plenty of water. . After receiving IV contrast, you may experience a mild flushed feeling. This is normal. . On occasion, you may experience a mild rash up to 24 hours after the test. This is not dangerous. If this occurs, you can take Benadryl 25 mg and increase your fluid intake. . If you experience trouble breathing, this can be serious. If it is severe call 911 IMMEDIATELY. If it is mild, please call our office. Marland Kitchen HOLD metformin for 48 hours after the procedure.   Once we have confirmed authorization from your insurance company, we will call you to set up a date and time for your test.   For non-scheduling related questions, please contact the cardiac imaging nurse navigator should you have any questions/concerns: Marchia Bond, Cardiac Imaging Nurse Navigator Burley Saver, Interim Cardiac Imaging Nurse [place] [facility] Direct Office Dial: [phone]   For scheduling needs, including cancellations and rescheduling, please call [phone].

## 2019-12-24 ENCOUNTER — Telehealth: Payer: Self-pay | Admitting: Interventional Cardiology

## 2019-12-24 NOTE — Telephone Encounter (Signed)
Follow up   Gabrielle Doyle from [facility] calling back to follow up. She said since there request is urgent appeal process is time sensitive

## 2019-12-24 NOTE — Telephone Encounter (Signed)
Gabrielle Doyle from [facility] calling stating they received an appeal for the patients CT. She states they did not get any office notes or records and would like to confirm what records can be sent.

## 2019-12-25 NOTE — Telephone Encounter (Signed)
Gabrielle Doyle from [facility] is following up on request for medical records.

## 2020-01-21 ENCOUNTER — Other Ambulatory Visit: Payer: Self-pay | Admitting: Internal Medicine

## 2020-01-27 ENCOUNTER — Other Ambulatory Visit: Payer: Self-pay | Admitting: Interventional Cardiology

## 2020-01-27 LAB — BASIC METABOLIC PANEL
BUN/Creatinine Ratio: 17 (ref 12–28)
BUN: 9 mg/dL (ref 8–27)
CO2: 28 mmol/L (ref 20–29)
Calcium: 9.2 mg/dL (ref 8.7–10.3)
Chloride: 105 mmol/L (ref 96–106)
Creatinine, Ser: 0.53 mg/dL — ABNORMAL LOW (ref 0.57–1.00)
GFR calc Af Amer: 106 mL/min/{1.73_m2} (ref >59)
GFR calc non Af Amer: 92 mL/min/{1.73_m2} (ref >59)
Glucose: 127 mg/dL — ABNORMAL HIGH (ref 65–99)
Potassium: 4.7 mmol/L (ref 3.5–5.2)
Sodium: 140 mmol/L (ref 134–144)

## 2020-02-04 ENCOUNTER — Telehealth (HOSPITAL_COMMUNITY): Payer: Self-pay | Admitting: *Deleted

## 2020-02-04 NOTE — Telephone Encounter (Signed)

## 2020-02-05 ENCOUNTER — Ambulatory Visit (HOSPITAL_COMMUNITY)
Admission: RE | Admit: 2020-02-05 | Discharge: 2020-02-05 | Disposition: A | Discharge status: Home / Self Care | Payer: Medicare HMO | Source: Ambulatory Visit | Attending: Interventional Cardiology | Admitting: Interventional Cardiology

## 2020-02-05 DIAGNOSIS — R0600 Dyspnea, unspecified: Secondary | ICD-10-CM | POA: Insufficient documentation

## 2020-02-05 DIAGNOSIS — R0609 Other forms of dyspnea: Secondary | ICD-10-CM

## 2020-02-05 DIAGNOSIS — I700 Atherosclerosis of aorta: Secondary | ICD-10-CM | POA: Insufficient documentation

## 2020-02-05 MED ORDER — NITROGLYCERIN 0.4 MG SL SUBL
SUBLINGUAL_TABLET | SUBLINGUAL | Status: AC
  Filled 2020-02-05: qty 2

## 2020-02-05 MED ORDER — SODIUM CHLORIDE 0.9% FLUSH: 10.0000 mL | Freq: Two times a day (BID) | INTRAVENOUS | Status: DC

## 2020-02-05 MED ORDER — SODIUM CHLORIDE 0.9% FLUSH: 10.0000 mL | INTRAVENOUS | Status: DC | PRN

## 2020-02-05 MED ORDER — NITROGLYCERIN 0.4 MG SL SUBL
0.8000 mg | SUBLINGUAL_TABLET | Freq: Once | SUBLINGUAL | Status: AC
  Administered 2020-02-05: 0.8 mg via SUBLINGUAL

## 2020-02-05 MED ORDER — IOHEXOL 350 MG/ML SOLN
80.0000 mL | Freq: Once | INTRAVENOUS | Status: AC | PRN
  Administered 2020-02-05: 80 mL via INTRAVENOUS

## 2021-06-22 DIAGNOSIS — Z1231 Encounter for screening mammogram for malignant neoplasm of breast: Secondary | ICD-10-CM | POA: Diagnosis not present

## 2021-08-23 DIAGNOSIS — H2513 Age-related nuclear cataract, bilateral: Secondary | ICD-10-CM | POA: Diagnosis not present

## 2021-08-23 DIAGNOSIS — H04123 Dry eye syndrome of bilateral lacrimal glands: Secondary | ICD-10-CM | POA: Diagnosis not present

## 2021-08-23 DIAGNOSIS — Z7984 Long term (current) use of oral hypoglycemic drugs: Secondary | ICD-10-CM | POA: Diagnosis not present

## 2021-08-23 DIAGNOSIS — H40053 Ocular hypertension, bilateral: Secondary | ICD-10-CM | POA: Diagnosis not present

## 2021-08-23 DIAGNOSIS — H5203 Hypermetropia, bilateral: Secondary | ICD-10-CM | POA: Diagnosis not present

## 2021-08-23 DIAGNOSIS — H40033 Anatomical narrow angle, bilateral: Secondary | ICD-10-CM | POA: Diagnosis not present

## 2021-08-23 DIAGNOSIS — H25013 Cortical age-related cataract, bilateral: Secondary | ICD-10-CM | POA: Diagnosis not present

## 2021-08-23 DIAGNOSIS — H35361 Drusen (degenerative) of macula, right eye: Secondary | ICD-10-CM | POA: Diagnosis not present

## 2021-08-23 DIAGNOSIS — H52202 Unspecified astigmatism, left eye: Secondary | ICD-10-CM | POA: Diagnosis not present

## 2021-08-23 DIAGNOSIS — H524 Presbyopia: Secondary | ICD-10-CM | POA: Diagnosis not present

## 2021-08-23 DIAGNOSIS — E119 Type 2 diabetes mellitus without complications: Secondary | ICD-10-CM | POA: Diagnosis not present

## 2022-01-05 DIAGNOSIS — H40053 Ocular hypertension, bilateral: Secondary | ICD-10-CM | POA: Diagnosis not present

## 2022-01-05 DIAGNOSIS — H40033 Anatomical narrow angle, bilateral: Secondary | ICD-10-CM | POA: Diagnosis not present

## 2022-01-05 DIAGNOSIS — H25813 Combined forms of age-related cataract, bilateral: Secondary | ICD-10-CM | POA: Diagnosis not present

## 2022-03-12 DIAGNOSIS — E785 Hyperlipidemia, unspecified: Secondary | ICD-10-CM | POA: Diagnosis not present

## 2022-03-12 DIAGNOSIS — I10 Essential (primary) hypertension: Secondary | ICD-10-CM | POA: Diagnosis not present

## 2022-03-12 DIAGNOSIS — Z79899 Other long term (current) drug therapy: Secondary | ICD-10-CM | POA: Diagnosis not present

## 2022-03-12 DIAGNOSIS — Z1211 Encounter for screening for malignant neoplasm of colon: Secondary | ICD-10-CM | POA: Diagnosis not present

## 2022-03-12 DIAGNOSIS — E1165 Type 2 diabetes mellitus with hyperglycemia: Secondary | ICD-10-CM | POA: Diagnosis not present

## 2022-03-12 DIAGNOSIS — Z23 Encounter for immunization: Secondary | ICD-10-CM | POA: Diagnosis not present

## 2022-05-02 ENCOUNTER — Encounter (HOSPITAL_BASED_OUTPATIENT_CLINIC_OR_DEPARTMENT_OTHER): Payer: Self-pay | Admitting: Emergency Medicine

## 2022-05-02 ENCOUNTER — Emergency Department (HOSPITAL_BASED_OUTPATIENT_CLINIC_OR_DEPARTMENT_OTHER): Payer: Medicare HMO

## 2022-05-02 ENCOUNTER — Other Ambulatory Visit: Payer: Self-pay

## 2022-05-02 ENCOUNTER — Emergency Department (HOSPITAL_BASED_OUTPATIENT_CLINIC_OR_DEPARTMENT_OTHER)
Admission: EM | Admit: 2022-05-02 | Discharge: 2022-05-02 | Disposition: A | Discharge status: Home / Self Care | Payer: Medicare HMO | Attending: Emergency Medicine | Admitting: Emergency Medicine

## 2022-05-02 DIAGNOSIS — E119 Type 2 diabetes mellitus without complications: Secondary | ICD-10-CM | POA: Diagnosis not present

## 2022-05-02 DIAGNOSIS — R0602 Shortness of breath: Secondary | ICD-10-CM | POA: Insufficient documentation

## 2022-05-02 DIAGNOSIS — R079 Chest pain, unspecified: Secondary | ICD-10-CM | POA: Diagnosis not present

## 2022-05-02 DIAGNOSIS — R0789 Other chest pain: Secondary | ICD-10-CM | POA: Diagnosis not present

## 2022-05-02 DIAGNOSIS — R000 Tachycardia, unspecified: Secondary | ICD-10-CM | POA: Diagnosis not present

## 2022-05-02 DIAGNOSIS — Z79899 Other long term (current) drug therapy: Secondary | ICD-10-CM | POA: Diagnosis not present

## 2022-05-02 DIAGNOSIS — Q278 Other specified congenital malformations of peripheral vascular system: Secondary | ICD-10-CM | POA: Diagnosis not present

## 2022-05-02 DIAGNOSIS — I10 Essential (primary) hypertension: Secondary | ICD-10-CM | POA: Diagnosis not present

## 2022-05-02 DIAGNOSIS — Z7982 Long term (current) use of aspirin: Secondary | ICD-10-CM | POA: Insufficient documentation

## 2022-05-02 DIAGNOSIS — Z7984 Long term (current) use of oral hypoglycemic drugs: Secondary | ICD-10-CM | POA: Insufficient documentation

## 2022-05-02 DIAGNOSIS — K5730 Diverticulosis of large intestine without perforation or abscess without bleeding: Secondary | ICD-10-CM | POA: Diagnosis not present

## 2022-05-02 DIAGNOSIS — Z9049 Acquired absence of other specified parts of digestive tract: Secondary | ICD-10-CM | POA: Diagnosis not present

## 2022-05-02 DIAGNOSIS — M7989 Other specified soft tissue disorders: Secondary | ICD-10-CM | POA: Insufficient documentation

## 2022-05-02 DIAGNOSIS — I2510 Atherosclerotic heart disease of native coronary artery without angina pectoris: Secondary | ICD-10-CM | POA: Diagnosis not present

## 2022-05-02 DIAGNOSIS — I700 Atherosclerosis of aorta: Secondary | ICD-10-CM | POA: Diagnosis not present

## 2022-05-02 LAB — CBC
HCT: 40.6 % (ref 36.0–46.0)
Hemoglobin: 13.6 g/dL (ref 12.0–15.0)
MCH: 30.8 pg (ref 26.0–34.0)
MCHC: 33.5 g/dL (ref 30.0–36.0)
MCV: 92.1 fL (ref 80.0–100.0)
Platelets: 167 10*3/uL (ref 150–400)
RBC: 4.41 MIL/uL (ref 3.87–5.11)
RDW: 13 % (ref 11.5–15.5)
WBC: 10.6 10*3/uL — ABNORMAL HIGH (ref 4.0–10.5)
nRBC: 0 % (ref 0.0–0.2)

## 2022-05-02 LAB — HEPATIC FUNCTION PANEL
ALT: 17 U/L (ref 0–44)
AST: 24 U/L (ref 15–41)
Albumin: 3.9 g/dL (ref 3.5–5.0)
Alkaline Phosphatase: 38 U/L (ref 38–126)
Bilirubin, Direct: 0.1 mg/dL (ref 0.0–0.2)
Indirect Bilirubin: 0.9 mg/dL (ref 0.3–0.9)
Total Bilirubin: 1 mg/dL (ref 0.3–1.2)
Total Protein: 7.6 g/dL (ref 6.5–8.1)

## 2022-05-02 LAB — BRAIN NATRIURETIC PEPTIDE: B Natriuretic Peptide: 74.5 pg/mL (ref 0.0–100.0)

## 2022-05-02 LAB — BASIC METABOLIC PANEL
Anion gap: 9 (ref 5–15)
BUN: 9 mg/dL (ref 8–23)
CO2: 27 mmol/L (ref 22–32)
Calcium: 9.2 mg/dL (ref 8.9–10.3)
Chloride: 97 mmol/L — ABNORMAL LOW (ref 98–111)
Creatinine, Ser: 0.63 mg/dL (ref 0.44–1.00)
GFR, Estimated: >60 mL/min (ref >60)
Glucose, Bld: 155 mg/dL — ABNORMAL HIGH (ref 70–99)
Potassium: 4 mmol/L (ref 3.5–5.1)
Sodium: 133 mmol/L — ABNORMAL LOW (ref 135–145)

## 2022-05-02 LAB — TROPONIN I (HIGH SENSITIVITY)
Troponin I (High Sensitivity): 3 ng/L (ref <18)
Troponin I (High Sensitivity): 4 ng/L (ref <18)

## 2022-05-02 LAB — LIPASE, BLOOD: Lipase: 31 U/L (ref 11–51)

## 2022-05-02 MED ORDER — PANTOPRAZOLE SODIUM 40 MG IV SOLR
40.0000 mg | Freq: Once | INTRAVENOUS | Status: AC
  Administered 2022-05-02: 40 mg via INTRAVENOUS
  Filled 2022-05-02: qty 10

## 2022-05-02 MED ORDER — MORPHINE SULFATE (PF) 4 MG/ML IV SOLN
4.0000 mg | Freq: Once | INTRAVENOUS | Status: AC
  Administered 2022-05-02: 4 mg via INTRAVENOUS
  Filled 2022-05-02: qty 1

## 2022-05-02 MED ORDER — OMEPRAZOLE 20 MG PO CPDR: 20.0000 mg | DELAYED_RELEASE_CAPSULE | Freq: Every day | ORAL | 0 refills | Status: DC

## 2022-05-02 MED ORDER — IOHEXOL 350 MG/ML SOLN
100.0000 mL | Freq: Once | INTRAVENOUS | Status: AC | PRN
  Administered 2022-05-02: 100 mL via INTRAVENOUS

## 2022-05-02 MED ORDER — HYDROCODONE-ACETAMINOPHEN 5-325 MG PO TABS: 1.0000 | ORAL_TABLET | Freq: Four times a day (QID) | ORAL | 0 refills | Status: DC | PRN

## 2022-05-02 NOTE — Discharge Instructions (Addendum)
1.  Start taking your omeprazole tomorrow morning.  Take daily about 30 minutes before you eat in the morning. 2.  Review instructions and information regarding gastroesophageal reflux disease.  At this time, your heart labs and CT scan did not show evidence of a heart attack or other emergent condition at this time.  Try treating gastroesophageal reflux symptoms and see if your pain is improving. 3.  You may take either extra strength Tylenol every 6 hours as needed or a Vicodin tablet if you need a stronger pain medication for the next couple of days.  If your symptoms are due to reflux, your pain should be improving over the next 2 to 5 days.  You must follow-up with your family doctor.  You may need referral to gastroenterology for an upper endoscopy for further evaluation. 4.  If you take the Vicodin tablets, they may cause constipation.  Take an over-the-counter stool softener such as Colace twice daily to prevent constipation. 5.  Return to emergency department immediately if you get new, worsening or concerning symptoms such as unusual shortness of breath, sudden change in your pain, confusion weakness or other concerning changes.

## 2022-05-02 NOTE — ED Notes (Signed)
Patient transported to X-ray 

## 2022-05-02 NOTE — ED Notes (Signed)
Chest pain began yesterday and remains consistent, describes as aching feeling, points to area at mid chest non radiating. States she has had some shortness of breath. Denies any strenuous activity with onset of chest pain. Tried some OTC antacids and tried tylenol with very little relief. Noted to have edema in ankles bilaterally.

## 2022-05-02 NOTE — ED Provider Notes (Signed)
[facility] DEPARTMENT Provider Note   CSN: [id number] Arrival date & time: 05/02/22  1632     History  Chief Complaint  Patient presents with   Chest Pain    Gabrielle Doyle is a 81 y.o. female.  HPI Patient reports he started having chest pain 2 days ago.  Has been constant.  Central chest and slightly to the left.  Deep aching quality.  Patient reports slight shortness of breath.  Family has noticed that more than the patient herself.  She reports she chronically has some swelling in her legs.  It does not seem to be more than usual.  No pain in the legs.  But she gets nighttime cramps.  Denies abdominal pain.  She denies radiation into her back.  She denies vomiting.  Denies fevers or cough.  Patient reports sometimes she takes some Tums type reflux medication but does not take any routinely and has not typically been treated for reflux in the past.    Home Medications Prior to Admission medications   Medication Sig Start Date End Date Taking? Authorizing Provider  HYDROcodone-acetaminophen (NORCO/VICODIN) 5-325 MG tablet Take 1 tablet by mouth every 6 (six) hours as needed for moderate pain or severe pain. 05/02/22  Yes Charlesetta Shanks, MD  omeprazole (PRILOSEC) 20 MG capsule Take 1 capsule (20 mg total) by mouth daily. 05/02/22  Yes Charlesetta Shanks, MD  aspirin 81 MG tablet Take 81 mg by mouth daily.    [provider]  CALCIUM PO Take 1,200 mg by mouth daily.     [provider]  cholecalciferol (VITAMIN D) 1000 UNITS tablet Take 2,000 Units by mouth daily.     [provider]  glucose blood (BAYER CONTOUR TEST) test strip Use as instructed. DX 250.00 08/05/12   McGowen, Adrian Blackwater, MD  glucose blood test strip Dispense Ultilet Lancet.  Use as directed.  DX 250.00 08/05/12   McGowen, Adrian Blackwater, MD  Lancet Devices (ONE [employer] SURESOFT) MISC Use when checking blood sugar 12/18/11   [provider]  losartan (COZAAR) 50 MG tablet  Take 50 mg by mouth daily. 10/26/19   [provider]  metFORMIN (GLUCOPHAGE) 500 MG tablet 1 in the morning and 2 in the evening 08/05/12   McGowen, Adrian Blackwater, MD  metoprolol tartrate (LOPRESSOR) 50 MG tablet Take 1 tablet 2 hours prior to Cardiac CT 11/23/19   Jettie Booze, MD  Multiple Vitamin (MULTIVITAMIN) capsule Take by mouth.    [provider]  nitroGLYCERIN (NITROSTAT) 0.4 MG SL tablet Place 0.4 mg under the tongue every 5 (five) minutes as needed.    [provider]  rosuvastatin (CRESTOR) 40 MG tablet Take 0.5 tablets (20 mg total) by mouth daily. 08/05/12   McGowen, Adrian Blackwater, MD      Allergies    Celecoxib, Lisinopril, and Lyrica [pregabalin]    Review of Systems   Review of Systems  Physical Exam Updated Vital Signs BP 133/71   Pulse 100   Temp 99.2 F (37.3 C) (Oral)   Resp 20   Ht '5\' 1"'$  (1.549 m)   Wt 68.4 kg   SpO2 100%   BMI 28.47 kg/m  Physical Exam Constitutional:      Comments: Alert nontoxic clinically well in appearance  HENT:     Mouth/Throat:     Pharynx: Oropharynx is clear.  Eyes:     Extraocular Movements: Extraocular movements intact.  Cardiovascular:     Rate and Rhythm:  Normal rate and regular rhythm.  Pulmonary:     Effort: Pulmonary effort is normal.     Breath sounds: Normal breath sounds.     Comments: Minimally tender chest wall to compression on the left.  Sternal chest wall. Abdominal:     General: There is no distension.     Palpations: Abdomen is soft.     Tenderness: There is no abdominal tenderness. There is no guarding.  Musculoskeletal:        General: Normal range of motion.     Cervical back: Neck supple.     Comments: Calf soft nontender.  No significant peripheral edema.  Feet warm and dry.  Distal pulses 2+ and symmetric.  Skin:    General: Skin is warm.  Neurological:     General: No focal deficit present.     Mental Status: She is oriented to person, place, and time.     Motor: No  weakness.     Coordination: Coordination normal.  Psychiatric:        Mood and Affect: Mood normal.     ED Results / Procedures / Treatments   Labs (all labs ordered are listed, but only abnormal results are displayed) Labs Reviewed  BASIC METABOLIC PANEL - Abnormal; Notable for the following components:      Result Value   Sodium 133 (*)    Chloride 97 (*)    Glucose, Bld 155 (*)    All other components within normal limits  CBC - Abnormal; Notable for the following components:   WBC 10.6 (*)    All other components within normal limits  BRAIN NATRIURETIC PEPTIDE  LIPASE, BLOOD  HEPATIC FUNCTION PANEL  TROPONIN I (HIGH SENSITIVITY)  TROPONIN I (HIGH SENSITIVITY)    EKG EKG Interpretation  Date/Time:  Wednesday May 02 2022 16:43:55 EDT Ventricular Rate:  103 PR Interval:  150 QRS Duration: 82 QT Interval:  356 QTC Calculation: 466 R Axis:   30 Text Interpretation: Sinus tachycardia Otherwise normal ECG No previous ECGs available inferior q wave, otherwise normal, no old comparison Confirmed by Charlesetta Shanks [phone]) on 05/02/2022 5:33:39 PM  Radiology CT Angio Chest/Abd/Pel for Dissection W and/or W/WO  Result Date: 05/02/2022 CLINICAL DATA:  Acute aortic syndrome suspected. EXAM: CT ANGIOGRAPHY CHEST, ABDOMEN AND PELVIS TECHNIQUE: Non-contrast CT of the chest was initially obtained. Multidetector CT imaging through the chest, abdomen and pelvis was performed using the standard protocol during bolus administration of intravenous contrast. Multiplanar reconstructed images and MIPs were obtained and reviewed to evaluate the vascular anatomy. RADIATION DOSE REDUCTION: This exam was performed according to the departmental dose-optimization program which includes automated exposure control, adjustment of the mA and/or kV according to patient size and/or use of iterative reconstruction technique. CONTRAST:  165m OMNIPAQUE IOHEXOL 350 MG/ML SOLN COMPARISON:  None Available.  FINDINGS: CTA CHEST FINDINGS Cardiovascular: Preferential opacification of the thoracic aorta. No evidence of thoracic aortic aneurysm or dissection. Normal heart size. No pericardial effusion. There are atherosclerotic calcifications of the aorta. Aberrant right subclavian artery is present, normal variation. Mediastinum/Nodes: No enlarged mediastinal, hilar, or axillary lymph nodes. Thyroid gland, trachea, and esophagus demonstrate no significant findings. Lungs/Pleura: Lungs are clear. No pleural effusion or pneumothorax. Musculoskeletal: No chest wall abnormality. No acute or significant osseous findings. Review of the MIP images confirms the above findings. CTA ABDOMEN AND PELVIS FINDINGS VASCULAR Aorta: Normal caliber aorta without aneurysm, dissection, vasculitis or significant stenosis. There are atherosclerotic calcifications of the aorta. Celiac: Patent without evidence of  aneurysm, dissection, vasculitis or significant stenosis. SMA: Patent without evidence of aneurysm, dissection, vasculitis or significant stenosis. Renals: Both renal arteries are patent without evidence of aneurysm, dissection, vasculitis, fibromuscular dysplasia or significant stenosis. Atherosclerotic calcifications are present. IMA: Patent without evidence of aneurysm, dissection, vasculitis or significant stenosis. Inflow: Patent without evidence of aneurysm, dissection, vasculitis or significant stenosis. Veins: No obvious venous abnormality within the limitations of this arterial phase study. Review of the MIP images confirms the above findings. NON-VASCULAR Hepatobiliary: No focal liver abnormality is seen. Status post cholecystectomy. No biliary dilatation. Pancreas: Unremarkable. No pancreatic ductal dilatation or surrounding inflammatory changes. Spleen: Normal in size without focal abnormality. Adrenals/Urinary Tract: Adrenal glands are unremarkable. Kidneys are normal, without renal calculi, focal lesion, or hydronephrosis.  Left renal cortical scarring present. Bladder is unremarkable. Stomach/Bowel: Stomach is within normal limits. No evidence of bowel wall thickening, distention, or inflammatory changes. Appendix is not seen. There is sigmoid colon diverticulosis. Lymphatic: No enlarged lymph nodes are seen. Reproductive: Status post hysterectomy. No adnexal masses. Other: No abdominal wall hernia or abnormality. No abdominopelvic ascites. Musculoskeletal: No acute or significant osseous findings. Review of the MIP images confirms the above findings. IMPRESSION: 1. No evidence for aortic dissection or aneurysm. 2. No acute cardiopulmonary process. 3. No acute process in the abdomen or pelvis. 4. Sigmoid colon diverticulosis. 5.  Aortic Atherosclerosis (ICD10-I70.0). Electronically Signed   By: Ronney Asters M.D.   On: 05/02/2022 19:10   DG Chest 2 View  Result Date: 05/02/2022 CLINICAL DATA:  Chest pain. EXAM: CHEST - 2 VIEW COMPARISON:  Nov 05, 2019. FINDINGS: The heart size and mediastinal contours are within normal limits. Both lungs are clear. The visualized skeletal structures are unremarkable. IMPRESSION: No active cardiopulmonary disease. Electronically Signed   By: Marijo Conception M.D.   On: 05/02/2022 17:34    Procedures Procedures    Medications Ordered in ED Medications  pantoprazole (PROTONIX) injection 40 mg (40 mg Intravenous Given 05/02/22 1836)  morphine (PF) 4 MG/ML injection 4 mg (4 mg Intravenous Given 05/02/22 1835)  iohexol (OMNIPAQUE) 350 MG/ML injection 100 mL (100 mLs Intravenous Contrast Given 05/02/22 1850)    ED Course/ Medical Decision Making/ A&P                           Medical Decision Making Amount and/or Complexity of Data Reviewed Labs: ordered. Radiology: ordered.  Risk Prescription drug management.   Patient presents as outlined with about 2 days of continuous central mid chest pain.  Patient has risk factors of advanced age, dyslipidemia, hypertension, type 2 diabetes.   We will proceed with diagnostic evaluation for ACS\differential includes PE, GERD or esophagitis, pancreatitis, pneumonia, aortic dissection.  [name] blood pressures are stable.  She is not significantly hypertensive at this time.  Will opt to treat with a dose of Protonix and morphine.  EKG reviewed by myself does not show acute ischemic pattern present.  No significant change from previous tracings.  2 troponins are negative.  Lipase normal.  CT dissection study reviewed by radiology and also personally visualized and reviewed by myself, no appearance of acute dissection or other emergent findings.  I have reassessed the patient.  She has gotten improved pain relief with the combination of Protonix and morphine.  Her mental status is clear.  She does not show any signs of somnolence or sedation.  Vital signs remained stable.  This time have extensively reviewed the diagnostic results and  home management plan with the patient and her 2 family members at bedside.  They voiced understanding and discharge instructions include complete review of addition of daily omeprazole, use of either acetaminophen or Vicodin for additional pain control for the next couple of days, GERD home management instructions and follow-up recommendations.  Return precautions have been reviewed with the patient and family members at bedside for any concerning changes in her condition.        Final Clinical Impression(s) / ED Diagnoses Final diagnoses:  Nonspecific chest pain    Rx / DC Orders ED Discharge Orders          Ordered    omeprazole (PRILOSEC) 20 MG capsule  Daily        05/02/22 1953    HYDROcodone-acetaminophen (NORCO/VICODIN) 5-325 MG tablet  Every 6 hours PRN        05/02/22 1953              Charlesetta Shanks, MD 05/02/22 2002

## 2022-05-02 NOTE — ED Triage Notes (Signed)
Mid chest sharp pain x 1 day , SOB with exertion . Bilateral ankle edema .  No obvious resp distress.

## 2022-05-02 NOTE — ED Notes (Signed)
Patient transported to CT 

## 2022-05-03 ENCOUNTER — Ambulatory Visit (INDEPENDENT_AMBULATORY_CARE_PROVIDER_SITE_OTHER): Payer: Medicare HMO | Admitting: Gastroenterology

## 2022-05-03 ENCOUNTER — Encounter: Payer: Self-pay | Admitting: Gastroenterology

## 2022-05-03 VITALS — BP 100/58 | HR 88 | Ht 59.5 in | Wt 145.0 lb

## 2022-05-03 DIAGNOSIS — R0789 Other chest pain: Secondary | ICD-10-CM | POA: Diagnosis not present

## 2022-05-03 DIAGNOSIS — K219 Gastro-esophageal reflux disease without esophagitis: Secondary | ICD-10-CM

## 2022-05-03 DIAGNOSIS — Z1211 Encounter for screening for malignant neoplasm of colon: Secondary | ICD-10-CM

## 2022-05-03 DIAGNOSIS — R152 Fecal urgency: Secondary | ICD-10-CM | POA: Diagnosis not present

## 2022-05-03 NOTE — Progress Notes (Signed)
HPI : Gabrielle Doyle is a very pleasant 81 year old female with a history of DM, PMR and HTN who is referred to [place] by Dr. Rolan Lipa for colon cancer screening.  The patient reports having a colonoscopy 10 or so years ago, thinks it was in [place].  She knows that it was normal and she did not have polyps. She is not sure if she has had more than one colonoscopy.   Her imported surgical history indicates a normal colonoscopy performed in 2010 by Dr. Lindalou Hose with recommendations to repeat in 2020. She denies any family history of colon cancer and denies concerning symptoms such as blood in stool or unintentional weight loss. She does have occasional problems with fecal urgency and incontinence with loose stools.  This tends to be more of a problem when she is going out in public, particularly when eating out.  This has been a long standing problem and is not new or progressive.  No abdominal pain.  Constipation rarely a problem.  She went to the ED yesterday with chest pain which was new for her.  The pain was described as an intense pressure sensation in her chest which lasted all day and night.  It was not described as a burning sensation, but she did report a 'fullness' in her chest.  No acid taste in mouth.  In the ED her troponins and EKG were unremarkable.  CT-A was also unremarkable.  She was given PPI, antacids and morphine and the pain resolved.  She was given a 30-day supply of omeprazole.  She does not typically have heartburn, chest pain or acid regurgitation, but does have chronic phlegm and a throat clearing cough every morning, which had previously been attributed to PND, but may be GERD.  No symptoms or hoarseness or sore throat. No dysphagia.  No nausea/vomiting.  No nocturnal cough symptoms.  She is fairly active at home, but does not do any exercise.  She does do yard work and chores around the house.  She denies any chest pain with activity or shortness of breath.  No  light-headedness/dizziness/presyncope.     Past Medical History:  Diagnosis Date   Basal cell carcinoma 12/2011   Left shoulder   Chest pain    cath clean 2013.  Presumed esophageal pain.   Diabetes mellitus, type 2 (HCC)    Dyslipidemia    Fibromyalgia    GERD (gastroesophageal reflux disease)    Hay fever    seasonal--fall esp   HTN (hypertension), benign 04/07/2012   Hyperlipidemia 04/07/2012   Hypertension    Leg pain, bilateral    Questionable diagnosis of fibromyalgia in past (per pt); some vague leg pains, lyrica was tried and gave her side effect so this dx  consideration has really been dropped.   Osteoarthritis    hands, light--"legs" nonspecific--tylenol is sufficient for her.   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   CARDIOVASCULAR STRESS TEST  12/2011   No ischemia; hypertensive/tachycardic response.  Normal LV  syst  fxn, 88%. EF.   CHOLECYSTECTOMY  1980   COLONOSCOPY  09/2008   normal (Dr. Lindalou Hose, MD),  Repeat TCS 2020.    TOTAL ABDOMINAL HYSTERECTOMY  1970s   non-malignant reasons; metromenorrhagia   Family History  Problem Relation Age of Onset   Hyperlipidemia Mother    Stroke Mother    Heart disease Mother    Hypertension Mother    Diabetes Mother    Hyperlipidemia Father    Heart disease Father    Hypertension Father    Diabetes Sister    Diabetes Brother    Heart attack Brother    Diabetes Daughter    Hypertension Daughter    Hypertension Daughter    Social History   Tobacco Use   Smoking status: Never    Smokeless tobacco: Never  Vaping Use   Vaping Use: Never used  Substance Use Topics   Alcohol use: Yes    Comment: fireball in the morning   Drug use: No   Current Outpatient Medications  Medication Sig Dispense Refill   aspirin 81 MG tablet Take 81 mg by mouth daily.     CALCIUM PO Take 1,200 mg by mouth daily.      cholecalciferol (VITAMIN D) 1000 UNITS tablet Take 2,000 Units by mouth daily.      glucose blood (BAYER CONTOUR TEST) test strip Use as instructed. DX 250.00 100 each 5   glucose blood test strip Dispense Ultilet Lancet.  Use as directed.  DX 250.00 100 each 5   HYDROcodone-acetaminophen (NORCO/VICODIN) 5-325 MG tablet Take 1 tablet by mouth every 6 (six) hours as needed for moderate pain or severe pain. 10 tablet 0   Lancet Devices (ONE TOUCH SURESOFT) MISC Use when checking blood sugar     losartan (COZAAR) 50 MG tablet Take 50 mg by mouth daily.     metFORMIN (GLUCOPHAGE) 500 MG tablet 1 in the morning and 2 in the evening 90 tablet 5   Multiple Vitamin (MULTIVITAMIN) capsule Take by mouth.     omeprazole (PRILOSEC) 20 MG capsule Take 1 capsule (20 mg total) by mouth daily. 30 capsule 0   rosuvastatin (CRESTOR) 40 MG tablet Take 0.5 tablets (20 mg total) by mouth daily. 30 tablet 5   nitroGLYCERIN (NITROSTAT) 0.4 MG SL tablet Place 0.4 mg under the tongue every 5 (five) minutes as needed. (Patient not taking: Reported on 05/03/2022)     No current facility-administered medications for this visit.   Allergies  Allergen Reactions   Celebrex [Celecoxib] Nausea And Vomiting    "Almost had a stroke"; syncope, slept all the time "Almost had a stroke"; syncope, slept all the time    Lisinopril Nausea And Vomiting   Lyrica [Pregabalin] Other (See Comments)    Hyponatremia     Review of Systems: All systems reviewed and negative except where noted in HPI.    CT Angio Chest/Abd/Pel for Dissection W and/or W/WO  Result Date: 05/02/2022 CLINICAL DATA:  Acute aortic  syndrome suspected. EXAM: CT ANGIOGRAPHY CHEST, ABDOMEN AND PELVIS TECHNIQUE: Non-contrast CT of the chest was initially obtained. Multidetector CT imaging through the chest, abdomen and pelvis was performed using the standard protocol during bolus administration of intravenous contrast. Multiplanar reconstructed images and MIPs were obtained and reviewed to evaluate the vascular anatomy. RADIATION DOSE REDUCTION: This exam was performed according to the departmental dose-optimization program which includes automated exposure control, adjustment of the mA and/or kV according to patient size and/or use of iterative reconstruction technique. CONTRAST:  111m OMNIPAQUE IOHEXOL 350 MG/ML SOLN COMPARISON:  None Available. FINDINGS: CTA CHEST FINDINGS Cardiovascular: Preferential opacification of the thoracic aorta. No evidence of thoracic aortic aneurysm or dissection. Normal heart size. No pericardial effusion. There are atherosclerotic calcifications of the aorta. Aberrant right subclavian artery is present, normal variation. Mediastinum/Nodes: No enlarged mediastinal, hilar, or axillary lymph nodes. Thyroid gland, trachea, and esophagus demonstrate no significant findings. Lungs/Pleura: Lungs are clear. No pleural effusion or pneumothorax. Musculoskeletal: No chest wall abnormality. No acute or significant osseous findings. Review of the MIP images confirms the above findings. CTA ABDOMEN AND PELVIS FINDINGS VASCULAR Aorta: Normal caliber aorta without aneurysm, dissection, vasculitis or significant stenosis. There are atherosclerotic calcifications of the aorta. Celiac: Patent without evidence of aneurysm, dissection, vasculitis or significant stenosis. SMA: Patent without evidence of aneurysm, dissection, vasculitis or significant stenosis. Renals: Both renal arteries are patent without evidence of aneurysm, dissection, vasculitis, fibromuscular dysplasia or significant stenosis. Atherosclerotic calcifications are  present. IMA: Patent without evidence of aneurysm, dissection, vasculitis or significant stenosis. Inflow: Patent without evidence of aneurysm, dissection, vasculitis or significant stenosis. Veins: No obvious venous abnormality within the limitations of this arterial phase study. Review of the MIP images confirms the above findings. NON-VASCULAR Hepatobiliary: No focal liver abnormality is seen. Status post cholecystectomy. No biliary dilatation. Pancreas: Unremarkable. No pancreatic ductal dilatation or surrounding inflammatory changes. Spleen: Normal in size without focal abnormality. Adrenals/Urinary Tract: Adrenal glands are unremarkable. Kidneys are normal, without renal calculi, focal lesion, or hydronephrosis. Left renal cortical scarring present. Bladder is unremarkable. Stomach/Bowel: Stomach is within normal limits. No evidence of bowel wall thickening, distention, or inflammatory changes. Appendix is not seen. There is sigmoid colon diverticulosis. Lymphatic: No enlarged lymph nodes are seen. Reproductive: Status post hysterectomy. No adnexal masses. Other: No abdominal wall hernia or abnormality. No abdominopelvic ascites. Musculoskeletal: No acute or significant osseous findings. Review of the MIP images confirms the above findings. IMPRESSION: 1. No evidence for aortic dissection or aneurysm. 2. No acute cardiopulmonary process. 3. No acute process in the abdomen or pelvis. 4. Sigmoid colon diverticulosis. 5.  Aortic Atherosclerosis (ICD10-I70.0). Electronically Signed   By: ARonney AstersM.D.   On: 05/02/2022 19:10   DG Chest 2 View  Result Date: 05/02/2022 CLINICAL DATA:  Chest pain. EXAM: CHEST - 2 VIEW COMPARISON:  Nov 05, 2019. FINDINGS: The heart size and mediastinal contours are within normal limits. Both lungs are clear. The visualized skeletal structures are unremarkable. IMPRESSION: No active cardiopulmonary disease. Electronically Signed   By: JMarijo ConceptionM.D.   On: 05/02/2022 17:34     Physical Exam: BP (!) 100/58 (BP Location: Left Arm, Patient Position: Sitting, Cuff Size: Normal)   Pulse 88 Comment: irregular  Ht 4' 11.5" (1.511 m) Comment: height measured without shoes  Wt 145 lb (65.8 kg)   BMI 28.80 kg/m  Constitutional: Pleasant,well-developed, Caucasian female in no acute distress.  Accompanied by daughter.  Patient able to rise from seated position with hand assist HEENT: Normocephalic and atraumatic. Conjunctivae are normal. No scleral icterus. Neck supple.  Cardiovascular: Normal rate, regular rhythm.   Systolic murmur at RUSB (3/6) Pulmonary/chest: Effort normal and breath sounds normal. No wheezing, rales or rhonchi. Abdominal: Soft, nondistended, nontender. Bowel sounds active throughout. There are no masses palpable. No hepatomegaly. Extremities: B/l LE edema Neurological: Alert and oriented to person place and time. Skin: Skin is warm and dry. No rashes noted. Psychiatric: Normal mood and affect. Behavior is normal.  CBC    Component  Value Date/Time   WBC 10.6 (H) 05/02/2022 1711   RBC 4.41 05/02/2022 1711   HGB 13.6 05/02/2022 1711   HCT 40.6 05/02/2022 1711   PLT 167 05/02/2022 1711   MCV 92.1 05/02/2022 1711   MCH 30.8 05/02/2022 1711   MCHC 33.5 05/02/2022 1711   RDW 13.0 05/02/2022 1711    CMP     Component Value Date/Time   NA 133 (L) 05/02/2022 1711   NA 140 01/27/2020 0900   K 4.0 05/02/2022 1711   CL 97 (L) 05/02/2022 1711   CO2 27 05/02/2022 1711   GLUCOSE 155 (H) 05/02/2022 1711   BUN 9 05/02/2022 1711   BUN 9 01/27/2020 0900   CREATININE 0.63 05/02/2022 1711   CALCIUM 9.2 05/02/2022 1711   PROT 7.6 05/02/2022 1711   ALBUMIN 3.9 05/02/2022 1711   AST 24 05/02/2022 1711   ALT 17 05/02/2022 1711   ALKPHOS 38 05/02/2022 1711   BILITOT 1.0 05/02/2022 1711   GFRNONAA >60 05/02/2022 1711   GFRAA 106 01/27/2020 0900     ASSESSMENT AND PLAN: 81 year old female with no lower GI symptoms or red flag symptoms, no family  history of colon cancer, referred for screening colonoscopy. She reportedly had a normal colonoscopy in 2010 by Dr. Lindalou Hose (report not available).  She has no known cardiopulmonary comorbidities.  Her chest pain episode does seem more likely to be GERD related rather than cardiac.  Her functional status is fairly good but her mobility seemed fairly limited on my brief interaction with her. We discussed how colon cancer screening after age 75 is considered on a case by case basis, taking into account the patient's overall health and life expectancy, as well as their risk factors for colon cancer (history of polyps, family history).  Overall, it may be reasonable to conclude she has a 10 year life expectancy.  We discussed doing no further colon cancer screening, doing a colonoscopy or doing a stool-based screening test.  After this discussion, the patient and her daughter both opted for a stool-based test.  Will order Cologuard.  If positive, will proceed with colonoscopy in the [place].  If negative, I would recommend against any further colon cancer screening. For her upper GI symptoms, I would recommend continuing the omeprazole for a month to see if this improves her morning phlegm and throat irritation.   For her occasional fecal urgency/incontinence, I recommended she start taking a daily fiber supplement such as Metamucil to bulk up her stool.  Colon cancer screening - Cologuard  Fecal urgency - Daily Metamucil  Morning phlegm/throat irritation - Continue omeprazole daily for a month to see if improves.  Gabrielle Doyle E. Candis Schatz, MD [facility]  CC:  Robyne Peers, MD

## 2022-05-03 NOTE — Patient Instructions (Signed)
_______________________________________________________  If you are age 79 or older, your body mass index should be between 23-30. Your Body mass index is 28.8 kg/m. If this is out of the aforementioned range listed, please consider follow up with your Primary Care Provider.  If you are age 90 or younger, your body mass index should be between 19-25. Your Body mass index is 28.8 kg/m. If this is out of the aformentioned range listed, please consider follow up with your Primary Care Provider.   Start Metamucil daily.  Take Prilosec daily for a month.  The  GI providers would like to encourage you to use [facility] to communicate with providers for non-urgent requests or questions.  Due to long hold times on the telephone, sending your provider a message by [facility] may be a faster and more efficient way to get a response.  Please allow 48 business hours for a response.  Please remember that this is for non-urgent requests.   It was a pleasure to see you today!  Thank you for trusting me with your gastrointestinal care!    Scott E.Candis Schatz, MD

## 2022-05-07 DIAGNOSIS — R0789 Other chest pain: Secondary | ICD-10-CM | POA: Diagnosis not present

## 2022-05-07 DIAGNOSIS — K219 Gastro-esophageal reflux disease without esophagitis: Secondary | ICD-10-CM | POA: Diagnosis not present

## 2022-05-07 DIAGNOSIS — M545 Low back pain, unspecified: Secondary | ICD-10-CM | POA: Diagnosis not present

## 2022-05-07 DIAGNOSIS — R1013 Epigastric pain: Secondary | ICD-10-CM | POA: Diagnosis not present

## 2022-05-07 DIAGNOSIS — G8929 Other chronic pain: Secondary | ICD-10-CM | POA: Diagnosis not present

## 2022-05-10 DIAGNOSIS — Z7984 Long term (current) use of oral hypoglycemic drugs: Secondary | ICD-10-CM | POA: Diagnosis not present

## 2022-05-10 DIAGNOSIS — H25013 Cortical age-related cataract, bilateral: Secondary | ICD-10-CM | POA: Diagnosis not present

## 2022-05-10 DIAGNOSIS — H40053 Ocular hypertension, bilateral: Secondary | ICD-10-CM | POA: Diagnosis not present

## 2022-05-10 DIAGNOSIS — H04123 Dry eye syndrome of bilateral lacrimal glands: Secondary | ICD-10-CM | POA: Diagnosis not present

## 2022-05-10 DIAGNOSIS — H40033 Anatomical narrow angle, bilateral: Secondary | ICD-10-CM | POA: Diagnosis not present

## 2022-05-10 DIAGNOSIS — H2513 Age-related nuclear cataract, bilateral: Secondary | ICD-10-CM | POA: Diagnosis not present

## 2022-05-10 DIAGNOSIS — E119 Type 2 diabetes mellitus without complications: Secondary | ICD-10-CM | POA: Diagnosis not present

## 2022-05-10 DIAGNOSIS — H35361 Drusen (degenerative) of macula, right eye: Secondary | ICD-10-CM | POA: Diagnosis not present

## 2022-05-11 DIAGNOSIS — Z1211 Encounter for screening for malignant neoplasm of colon: Secondary | ICD-10-CM | POA: Diagnosis not present

## 2022-05-19 LAB — COLOGUARD: COLOGUARD: POSITIVE — AB

## 2022-05-22 ENCOUNTER — Encounter: Payer: Self-pay | Admitting: Nurse Practitioner

## 2022-05-22 ENCOUNTER — Telehealth: Payer: Self-pay | Admitting: Gastroenterology

## 2022-05-22 NOTE — Progress Notes (Signed)
Ms. Weinheimer,  Your Cologuard test was positive.  As discussed in clinic, a colonoscopy is warranted to further evaluate for colon cancer or large colon polyps.  Vaughan Basta,  Can you please get Ms. Nagy scheduled for a colonoscopy in the [place]?

## 2022-05-22 NOTE — Telephone Encounter (Signed)
Called patient to schedule [facility] colonoscopy left voicemail.

## 2022-06-21 ENCOUNTER — Ambulatory Visit (AMBULATORY_SURGERY_CENTER): Payer: Medicare HMO | Admitting: *Deleted

## 2022-06-21 VITALS — Ht 61.0 in | Wt 150.0 lb

## 2022-06-21 DIAGNOSIS — Z1211 Encounter for screening for malignant neoplasm of colon: Secondary | ICD-10-CM

## 2022-06-21 MED ORDER — NA SULFATE-K SULFATE-MG SULF 17.5-3.13-1.6 GM/177ML PO SOLN: 1.0000 | Freq: Once | ORAL | 0 refills | Status: AC

## 2022-06-21 NOTE — Progress Notes (Signed)
Pre-visit via telephone Instructions sent via my chart and by mail No egg or soy allergy known to patient  No issues known to pt with past sedation with any surgeries or procedures Patient denies ever being told they had issues or difficulty with intubation  No FH of Malignant Hyperthermia Pt is not on diet pills Pt is not on  home 02  Pt is not on blood thinners  Pt denies issues with constipation  Pt encouraged to use to use Singlecare or Goodrx to reduce cost  [name] chart reviewed by [name] prior to previsit and patient appropriate for the [facility].  Previsit completed and red dot placed by patient's name on their procedure day (on provider's schedule).  Gabrielle Doyle

## 2022-06-26 DIAGNOSIS — Z1231 Encounter for screening mammogram for malignant neoplasm of breast: Secondary | ICD-10-CM | POA: Diagnosis not present

## 2022-06-28 DIAGNOSIS — N6489 Other specified disorders of breast: Secondary | ICD-10-CM | POA: Diagnosis not present

## 2022-06-28 DIAGNOSIS — N641 Fat necrosis of breast: Secondary | ICD-10-CM | POA: Diagnosis not present

## 2022-06-28 DIAGNOSIS — R928 Other abnormal and inconclusive findings on diagnostic imaging of breast: Secondary | ICD-10-CM | POA: Diagnosis not present

## 2022-07-01 ENCOUNTER — Other Ambulatory Visit: Payer: Self-pay

## 2022-07-01 ENCOUNTER — Emergency Department (HOSPITAL_BASED_OUTPATIENT_CLINIC_OR_DEPARTMENT_OTHER)
Admission: EM | Admit: 2022-07-01 | Discharge: 2022-07-01 | Disposition: A | Discharge status: Home / Self Care | Payer: Medicare HMO | Attending: Emergency Medicine | Admitting: Emergency Medicine

## 2022-07-01 ENCOUNTER — Encounter (HOSPITAL_BASED_OUTPATIENT_CLINIC_OR_DEPARTMENT_OTHER): Payer: Self-pay

## 2022-07-01 DIAGNOSIS — Z7982 Long term (current) use of aspirin: Secondary | ICD-10-CM | POA: Diagnosis not present

## 2022-07-01 DIAGNOSIS — R059 Cough, unspecified: Secondary | ICD-10-CM | POA: Diagnosis not present

## 2022-07-01 DIAGNOSIS — U071 COVID-19: Secondary | ICD-10-CM | POA: Insufficient documentation

## 2022-07-01 LAB — RESP PANEL BY RT-PCR (RSV, FLU A&B, COVID)  RVPGX2
Influenza A by PCR: NEGATIVE
Influenza B by PCR: NEGATIVE
Resp Syncytial Virus by PCR: NEGATIVE
SARS Coronavirus 2 by RT PCR: POSITIVE — AB

## 2022-07-01 MED ORDER — NIRMATRELVIR/RITONAVIR (PAXLOVID)TABLET: 3.0000 | ORAL_TABLET | Freq: Two times a day (BID) | ORAL | 0 refills | Status: AC

## 2022-07-01 NOTE — ED Provider Notes (Signed)
[facility] EMERGENCY DEPARTMENT Provider Note   CSN: [id number] Arrival date & time: 07/01/22  1846     History  Chief Complaint  Patient presents with   Cough    Gabrielle Doyle is a 81 y.o. female.  81 year old female who presents the ER today secondary to cough.  Patient states that she has had a nonproductive cough for the last few days is actually improved a little bit but she took a home COVID test because she lost her taste and it was positive.  She decided to come here to get checked out.  She has been eating and drinking normally.  No other associated symptoms.  Unknown sick contacts but did she did get a mammogram last week.  No blood thinners.  No history of kidney problems.   Cough      Home Medications Prior to Admission medications   Medication Sig Start Date End Date Taking? Authorizing Provider  nirmatrelvir/ritonavir (PAXLOVID) 20 x 150 MG & 10 x '100MG'$  TABS Take 3 tablets by mouth 2 (two) times daily for 5 days. Patient GFR is >60. Take nirmatrelvir (150 mg) two tablets twice daily for 5 days and ritonavir (100 mg) one tablet twice daily for 5 days. 07/01/22 07/06/22 Yes Daniesha Driver, Corene Cornea, MD  aspirin 81 MG tablet Take 81 mg by mouth daily.    [provider]  CALCIUM PO Take 1,200 mg by mouth daily.     [provider]  cholecalciferol (VITAMIN D) 1000 UNITS tablet Take 2,000 Units by mouth daily.     [provider]  glucose blood (BAYER CONTOUR TEST) test strip Use as instructed. DX 250.00 08/05/12   McGowen, Adrian Blackwater, MD  glucose blood test strip Dispense Ultilet Lancet.  Use as directed.  DX 250.00 08/05/12   McGowen, Adrian Blackwater, MD  HYDROcodone-acetaminophen (NORCO/VICODIN) 5-325 MG tablet Take 1 tablet by mouth every 6 (six) hours as needed for moderate pain or severe pain. Patient not taking: Reported on 06/21/2022 05/02/22   Charlesetta Shanks, MD  Lancet Devices ([place]) MISC Use when checking blood sugar 12/18/11    [provider]  losartan (COZAAR) 50 MG tablet Take 50 mg by mouth daily. 10/26/19   [provider]  metFORMIN (GLUCOPHAGE) 500 MG tablet 1 in the morning and 2 in the evening 08/05/12   McGowen, Adrian Blackwater, MD  Multiple Vitamin (MULTIVITAMIN) capsule Take by mouth.    [provider]  nitroGLYCERIN (NITROSTAT) 0.4 MG SL tablet Place 0.4 mg under the tongue every 5 (five) minutes as needed. Patient not taking: Reported on 05/03/2022    [provider]  omeprazole (PRILOSEC) 20 MG capsule Take 1 capsule (20 mg total) by mouth daily. 05/02/22   Charlesetta Shanks, MD  rosuvastatin (CRESTOR) 40 MG tablet Take 0.5 tablets (20 mg total) by mouth daily. 08/05/12   McGowen, Adrian Blackwater, MD      Allergies    Celebrex [celecoxib], Lisinopril, and Lyrica [pregabalin]    Review of Systems   Review of Systems  Respiratory:  Positive for cough.     Physical Exam Updated Vital Signs BP (!) 152/89 (BP Location: Right Arm)   Pulse 79   Temp 99.2 F (37.3 C) (Oral)   Resp 20   Ht '5\' 1"'$  (1.549 m)   Wt 68 kg   SpO2 96%   BMI 28.33 kg/m  Physical Exam Vitals and nursing note reviewed.  Constitutional:      Appearance: She is well-developed.  HENT:     Head: Normocephalic and atraumatic.     Mouth/Throat:     Mouth: Mucous membranes are moist.     Pharynx: Oropharynx is clear.  Eyes:     Pupils: Pupils are equal, round, and reactive to light.  Cardiovascular:     Rate and Rhythm: Normal rate and regular rhythm.  Pulmonary:     Effort: No respiratory distress.     Breath sounds: No stridor.  Abdominal:     General: Abdomen is flat. There is no distension.  Musculoskeletal:        General: No swelling or tenderness.     Cervical back: Normal range of motion.  Skin:    General: Skin is warm and dry.  Neurological:     General: No focal deficit present.     Mental Status: She is alert.     ED Results / Procedures / Treatments   Labs (all labs ordered are  listed, but only abnormal results are displayed) Labs Reviewed  RESP PANEL BY RT-PCR (RSV, FLU A&B, COVID)  [id number] - Abnormal; Notable for the following components:      Result Value   SARS Coronavirus 2 by RT PCR POSITIVE (*)    All other components within normal limits    EKG None  Radiology No results found.  Procedures Procedures    Medications Ordered in ED Medications - No data to display  ED Course/ Medical Decision Making/ A&P                           Medical Decision Making  81 year old female here with COVID.  No history of kidney problems or blood thinners or any other disqualifying factors for Paxlovid.  Overall appears well with no distress and is likely improving.  Will offer Paxlovid PCP follow-up in a few days if not improving.  Otherwise will return here for new or worsening symptoms.  Final Clinical Impression(s) / ED Diagnoses Final diagnoses:  COVID    Rx / DC Orders ED Discharge Orders          Ordered    nirmatrelvir/ritonavir (PAXLOVID) 20 x 150 MG & 10 x '100MG'$  TABS  2 times daily        07/01/22 2111              Merrily Pew, MD 07/01/22 2120

## 2022-07-01 NOTE — ED Triage Notes (Signed)
C/o cough, fatigue, runny nose x 2 days  Positive home covid test. Wants to "make sure".

## 2022-07-13 ENCOUNTER — Encounter: Payer: Self-pay | Admitting: Gastroenterology

## 2022-07-17 ENCOUNTER — Ambulatory Visit (AMBULATORY_SURGERY_CENTER): Payer: Medicare HMO | Admitting: Gastroenterology

## 2022-07-17 ENCOUNTER — Encounter: Payer: Self-pay | Admitting: Gastroenterology

## 2022-07-17 VITALS — BP 114/70 | HR 64 | Temp 96.0°F | Resp 10 | Ht 60.0 in | Wt 150.0 lb

## 2022-07-17 DIAGNOSIS — D120 Benign neoplasm of cecum: Secondary | ICD-10-CM

## 2022-07-17 DIAGNOSIS — E119 Type 2 diabetes mellitus without complications: Secondary | ICD-10-CM | POA: Diagnosis not present

## 2022-07-17 DIAGNOSIS — I10 Essential (primary) hypertension: Secondary | ICD-10-CM | POA: Diagnosis not present

## 2022-07-17 DIAGNOSIS — D127 Benign neoplasm of rectosigmoid junction: Secondary | ICD-10-CM | POA: Diagnosis not present

## 2022-07-17 DIAGNOSIS — D123 Benign neoplasm of transverse colon: Secondary | ICD-10-CM | POA: Diagnosis not present

## 2022-07-17 DIAGNOSIS — D125 Benign neoplasm of sigmoid colon: Secondary | ICD-10-CM

## 2022-07-17 DIAGNOSIS — M797 Fibromyalgia: Secondary | ICD-10-CM | POA: Diagnosis not present

## 2022-07-17 DIAGNOSIS — R195 Other fecal abnormalities: Secondary | ICD-10-CM

## 2022-07-17 DIAGNOSIS — Z1211 Encounter for screening for malignant neoplasm of colon: Secondary | ICD-10-CM

## 2022-07-17 DIAGNOSIS — D128 Benign neoplasm of rectum: Secondary | ICD-10-CM

## 2022-07-17 MED ORDER — SODIUM CHLORIDE 0.9 % IV SOLN: 500.0000 mL | Freq: Once | INTRAVENOUS | Status: DC

## 2022-07-17 NOTE — Progress Notes (Signed)
Pt's states no medical or surgical changes since previsit or office visit. 

## 2022-07-17 NOTE — Progress Notes (Signed)
Called to room to assist during endoscopic procedure.  Patient ID and intended procedure confirmed with present staff. Received instructions for my participation in the procedure from the performing physician.  

## 2022-07-17 NOTE — Progress Notes (Signed)
Report to PACU, RN, vss, BBS= Clear.  

## 2022-07-17 NOTE — Progress Notes (Signed)
[facility] History and Physical   Primary Care Physician:  Robyne Peers, MD   Reason for Procedure:   Positive Cologuard  Plan:    Colonoscopy     HPI: Gabrielle Doyle is a 82 y.o. female undergoing colonoscopy following a positive Cologuard test.  She denies any chronic lower GI symptoms.  No family history of colon cancer.  Her last colonoscopy was in 2010 and was normal.   Past Medical History:  Diagnosis Date   Allergy    Chest pain    cath clean 2013.  Presumed esophageal pain.   Diabetes mellitus, type 2 (HCC)    Dyslipidemia    Fibromyalgia    GERD (gastroesophageal reflux disease)    Hay fever    seasonal--fall esp   HTN (hypertension), benign 04/07/2012   Hyperlipidemia 04/07/2012   Hypertension    Leg pain, bilateral    Questionable diagnosis of fibromyalgia in past (per pt); some vague leg pains, lyrica was tried and gave her side effect so this dx  consideration has really been dropped.   Osteoarthritis    hands, light--"legs" nonspecific--tylenol is sufficient for her.   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   Postmenopausal atrophic vaginitis    Postmenopause atrophic vaginitis 08/05/2012   Type II or unspecified type diabetes mellitus without mention of complication, not stated as uncontrolled 04/07/2012    Past Surgical History:  Procedure Laterality Date   BREAST BIOPSY     x 2, left   CARDIAC CATHETERIZATION  12/2011   Smooth coronaries and normal LV fxn.   CARDIOVASCULAR STRESS TEST  12/2011   No ischemia; hypertensive/tachycardic response.  Normal LV  syst fxn, 88%. EF.   CHOLECYSTECTOMY  1980   COLONOSCOPY  09/2008   normal (Dr. Lindalou Hose, MD),  Repeat TCS 2020.    TOTAL ABDOMINAL HYSTERECTOMY  1970s   non-malignant reasons; metromenorrhagia    Prior to Admission medications   Medication Sig Start Date End Date Taking? Authorizing Provider  aspirin  81 MG tablet Take 81 mg by mouth daily.    [provider]  CALCIUM PO Take 1,200 mg by mouth daily.     [provider]  cholecalciferol (VITAMIN D) 1000 UNITS tablet Take 2,000 Units by mouth daily.     [provider]  glucose blood (BAYER CONTOUR TEST) test strip Use as instructed. DX 250.00 08/05/12   McGowen, Adrian Blackwater, MD  glucose blood test strip Dispense Ultilet Lancet.  Use as directed.  DX 250.00 08/05/12   McGowen, Adrian Blackwater, MD  Lancet Devices (ONE [place] SURESOFT) MISC Use when checking blood sugar 12/18/11   [provider]  losartan (COZAAR) 50 MG tablet Take 50 mg by mouth daily. 10/26/19   [provider]  metFORMIN (GLUCOPHAGE) 500 MG tablet 1 in the morning and 2 in the evening 08/05/12   McGowen, Adrian Blackwater, MD  Multiple Vitamin (MULTIVITAMIN) capsule Take by mouth.    [provider]  nitroGLYCERIN (NITROSTAT) 0.4 MG SL tablet Place 0.4 mg under the tongue every 5 (five) minutes as needed. Patient not taking: Reported on 05/03/2022    [provider]  omeprazole (PRILOSEC) 20 MG capsule Take 1 capsule (20 mg total) by mouth daily. 05/02/22   Charlesetta Shanks, MD  rosuvastatin (CRESTOR) 40 MG tablet Take 0.5 tablets (20 mg total) by mouth daily. 08/05/12   McGowen, Adrian Blackwater, MD  Current Outpatient Medications  Medication Sig Dispense Refill   aspirin 81 MG tablet Take 81 mg by mouth daily.     CALCIUM PO Take 1,200 mg by mouth daily.      cholecalciferol (VITAMIN D) 1000 UNITS tablet Take 2,000 Units by mouth daily.      glucose blood (BAYER CONTOUR TEST) test strip Use as instructed. DX 250.00 100 each 5   glucose blood test strip Dispense Ultilet Lancet.  Use as directed.  DX 250.00 100 each 5   Lancet Devices (ONE TOUCH SURESOFT) MISC Use when checking blood sugar     losartan (COZAAR) 50 MG tablet Take 50 mg by mouth daily.     metFORMIN (GLUCOPHAGE) 500 MG tablet 1 in the morning and 2 in the evening 90 tablet 5    Multiple Vitamin (MULTIVITAMIN) capsule Take by mouth.     nitroGLYCERIN (NITROSTAT) 0.4 MG SL tablet Place 0.4 mg under the tongue every 5 (five) minutes as needed. (Patient not taking: Reported on 05/03/2022)     omeprazole (PRILOSEC) 20 MG capsule Take 1 capsule (20 mg total) by mouth daily. 30 capsule 0   rosuvastatin (CRESTOR) 40 MG tablet Take 0.5 tablets (20 mg total) by mouth daily. 30 tablet 5   No current facility-administered medications for this visit.    Allergies as of 07/17/2022 - Review Complete 07/01/2022  Allergen Reaction Noted   Celebrex [celecoxib] Nausea And Vomiting 02/19/2014   Lisinopril Nausea And Vomiting 08/23/2014   Lyrica [pregabalin] Other (See Comments) 12/18/2011    Family History  Problem Relation Age of Onset   Hyperlipidemia Mother    Stroke Mother    Heart disease Mother    Hypertension Mother    Diabetes Mother    Hyperlipidemia Father    Heart disease Father    Hypertension Father    Diabetes Sister    Diabetes Brother    Heart attack Brother    Diabetes Daughter    Hypertension Daughter    Hypertension Daughter    Colon cancer Neg Hx    Colon polyps Neg Hx    Esophageal cancer Neg Hx    Rectal cancer Neg Hx    Stomach cancer Neg Hx     Social History   Socioeconomic History   Marital status: Married    Spouse name: Not on file   Number of children: 3   Years of education: Not on file   Highest education level: Not on file  Occupational History   Occupation: retired  Tobacco Use   Smoking status: Never   Smokeless tobacco: Never  Vaping Use   Vaping Use: Never used  Substance and Sexual Activity   Alcohol use: Yes    Comment: fireball in the morning   Drug use: No   Sexual activity: Not on file  Other Topics Concern   Not on file  Social History Narrative   Married, 3 children living and around this region.   Orig from [place]   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  Level of education 12 grade.   No T/A/Ds.    Exercise: walks, works in yard.   Social Determinants of Health   Financial Resource Strain: Not on file  Food Insecurity: Not on file  Transportation Needs: Not on file  Physical Activity: Not on file  Stress: Not on file  Social Connections: Not on file  Intimate Partner Violence: Not on file    Review of Systems:  All other review of systems negative  except as mentioned in the HPI.  Physical Exam: Vital signs There were no vitals taken for this visit.  General:   Alert,  Well-developed, well-nourished, pleasant and cooperative in NAD Airway:  Mallampati 2 Lungs:  Clear throughout to auscultation.   Heart:  Regular rate and rhythm; no murmurs, clicks, rubs,  or gallops. Abdomen:  Soft, nontender and nondistended. Normal bowel sounds.   Neuro/Psych:  Normal mood and affect. A and O x 3   Lorrena Goranson E. Candis Schatz, MD [facility]

## 2022-07-17 NOTE — Patient Instructions (Addendum)
Handouts given for polyps and diverticulosis.  RESUME PREVIOUS DIET. RESUME PREVIOUS MEDICATIONS.  SCREENING COLONOSCOPY NOT RECOMMENDED, HOWEVER, PLEASE CONTACT THE OFFICE IF YOU HAVE ANY GI CONCERN IN THE FUTURE.  YOU HAD AN ENDOSCOPIC PROCEDURE TODAY AT [facility]:   Refer to the procedure report that was given to you for any specific questions about what was found during the examination.  If the procedure report does not answer your questions, please call your gastroenterologist to clarify.  If you requested that your care partner not be given the details of your procedure findings, then the procedure report has been included in a sealed envelope for you to review at your convenience later.  YOU SHOULD EXPECT: Some feelings of bloating in the abdomen. Passage of more gas than usual.  Walking can help get rid of the air that was put into your GI tract during the procedure and reduce the bloating. If you had a lower endoscopy (such as a colonoscopy or flexible sigmoidoscopy) you may notice spotting of blood in your stool or on the toilet paper. If you underwent a bowel prep for your procedure, you may not have a normal bowel movement for a few days.  Please Note:  You might notice some irritation and congestion in your nose or some drainage.  This is from the oxygen used during your procedure.  There is no need for concern and it should clear up in a day or so.  SYMPTOMS TO REPORT IMMEDIATELY:  Following lower endoscopy (colonoscopy):  Excessive amounts of blood in the stool  Significant tenderness or worsening of abdominal pains  Swelling of the abdomen that is new, acute  Fever of 100F or higher  For urgent or emergent issues, a gastroenterologist can be reached at any hour by calling [phone]. Do not use MyChart messaging for urgent concerns.   DIET:  We do recommend a small meal at first, but then you may proceed to your regular diet.  Drink plenty of fluids but  you should avoid alcoholic beverages for 24 hours.  ACTIVITY:  You should plan to take it easy for the rest of today and you should NOT DRIVE or use heavy machinery until tomorrow (because of the sedation medicines used during the test).    FOLLOW UP: Our staff will call the number listed on your records the next business day following your procedure.  We will call around 7:15- 8:00 am to check on you and address any questions or concerns that you may have regarding the information given to you following your procedure. If we do not reach you, we will leave a message.     If any biopsies were taken you will be contacted by phone or by letter within the next 1-3 weeks.  Please call us at [phone] if you have not heard about the biopsies in 3 weeks.    SIGNATURES/CONFIDENTIALITY: You and/or your care partner have signed paperwork which will be entered into your electronic medical record.  These signatures attest to the fact that that the information above on your After Visit Summary has been reviewed and is understood.  Full responsibility of the confidentiality of this discharge information lies with you and/or your care-partner.

## 2022-07-17 NOTE — Op Note (Signed)
[facility] Patient Name: Gabrielle Doyle Procedure Date: 07/17/2022 1:33 PM MRN: [id number] Endoscopist: Nicki Reaper E. Candis Schatz , MD, [id number] Age: 82 Referring MD:  Date of Birth: 12/03/1940 Gender: Female Account #: [id number] Procedure:                Colonoscopy Indications:              Positive Cologuard test Medicines:                Monitored Anesthesia Care Procedure:                Pre-Anesthesia Assessment:                           - Prior to the procedure, a History and Physical                            was performed, and patient medications and                            allergies were reviewed. The patient's tolerance of                            previous anesthesia was also reviewed. The risks                            and benefits of the procedure and the sedation                            options and risks were discussed with the patient.                            All questions were answered, and informed consent                            was obtained. Prior Anticoagulants: The patient has                            taken no anticoagulant or antiplatelet agents                            except for aspirin. ASA Grade Assessment: III - A                            patient with severe systemic disease. After                            reviewing the risks and benefits, the patient was                            deemed in satisfactory condition to undergo the                            procedure.  After obtaining informed consent, the colonoscope                            was passed under direct vision. Throughout the                            procedure, the patient's blood pressure, pulse, and                            oxygen saturations were monitored continuously. The                            Olympus PCF-H190DL (#[id number]) Colonoscope was                            introduced through the anus and advanced to the the                             terminal ileum, with identification of the                            appendiceal orifice and IC valve. The 0441                            PCF-H190TL Slim SB Colonoscope was introduced                            through the anus and advanced to the the terminal                            ileum, with identification of the appendiceal                            orifice and IC valve. The colonoscopy was unusually                            difficult due to multiple diverticula in the colon                            and restricted mobility of the colon with a fixed                            angulation in the sigmoid colon that was not                            traversable with the pediatric colonoscope.                            Successful completion of the procedure was aided by                            changing the patient to a supine position, using  manual pressure, withdrawing and reinserting the                            scope and withdrawing the scope and replacing with                            the UltraSlim scope. The patient tolerated the                            procedure well. The quality of the bowel                            preparation was good. The terminal ileum, ileocecal                            valve, appendiceal orifice, and rectum were                            photographed. The bowel preparation used was SUPREP                            via split dose instruction. Scope In: 1:39:39 PM Scope Out: 2:20:51 PM Scope Withdrawal Time: 0 hours 14 minutes 43 seconds  Total Procedure Duration: 0 hours 41 minutes 12 seconds  Findings:                 The perianal and digital rectal examinations were                            normal. Pertinent negatives include normal                            sphincter tone and no palpable rectal lesions.                           A 6 mm polyp was found in the sigmoid colon. The                             polyp was sessile. The polyp was removed with a                            cold snare. Resection and retrieval were complete.                            Estimated blood loss was minimal.                           A 1 mm polyp was found in the cecum. The polyp was                            sessile. The polyp was removed with a cold snare.  Resection and retrieval were complete. Estimated                            blood loss was minimal.                           A 5 mm polyp was found in the transverse colon. The                            polyp was flat. The polyp was removed with a cold                            snare. Resection and retrieval were complete.                            Estimated blood loss was minimal.                           A 2 mm polyp was found in the rectum. The polyp was                            sessile. The polyp was removed with a cold snare.                            Resection and retrieval were complete. Estimated                            blood loss was minimal.                           Many large-mouthed and small-mouthed diverticula                            were found in the sigmoid colon and descending                            colon. There was narrowing of the colon in                            association with the diverticular opening.                           A single medium-sized angiodysplastic lesion                            without bleeding was found in the ascending colon.                           The exam was otherwise normal throughout the                            examined colon.  The terminal ileum appeared normal.                           The retroflexed view of the distal rectum and anal                            verge was normal and showed no anal or rectal                            abnormalities. Complications:            No immediate  complications. Estimated Blood Loss:     Estimated blood loss was minimal. Impression:               - One 6 mm polyp in the sigmoid colon, removed with                            a cold snare. Resected and retrieved.                           - One 1 mm polyp in the cecum, removed with a cold                            snare. Resected and retrieved.                           - One 5 mm polyp in the transverse colon, removed                            with a cold snare. Resected and retrieved.                           - One 2 mm polyp in the rectum, removed with a cold                            snare. Resected and retrieved.                           - Severe diverticulosis in the sigmoid colon and in                            the descending colon. There was narrowing of the                            colon in association with the diverticular opening.                           - A single non-bleeding colonic angiodysplastic                            lesion.                           - The examined portion of the ileum was normal.                           -  The distal rectum and anal verge are normal on                            retroflexion view. Recommendation:           - Patient has a contact number available for                            emergencies. The signs and symptoms of potential                            delayed complications were discussed with the                            patient. Return to normal activities tomorrow.                            Written discharge instructions were provided to the                            patient.                           - Resume previous diet.                           - Continue present medications.                           - Await pathology results.                           - [name] age and lack of advanced polyps, as                            well as technical difficulty of colonoscopy, I                             would recommend against further colon cancer                            screening Nakai Yard E. Candis Schatz, MD 07/17/2022 2:31:45 PM This report has been signed electronically.

## 2022-07-18 ENCOUNTER — Telehealth: Payer: Self-pay

## 2022-07-18 NOTE — Telephone Encounter (Signed)
  Follow up Call-     07/17/2022    1:21 PM  Call back number  Post procedure Call Back phone  # [phone]  Permission to leave phone message Yes     Patient questions:  Do you have a fever, pain , or abdominal swelling? No. Pain Score  0 *  Have you tolerated food without any problems? Yes.    Have you been able to return to your normal activities? Yes.    Do you have any questions about your discharge instructions: Diet   No. Medications  No. Follow up visit  No.  Do you have questions or concerns about your Care? No.  Actions: * If pain score is 4 or above: No action needed, pain <4.

## 2022-07-31 NOTE — Progress Notes (Signed)
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  These polyps have been completely removed and do not pose any further risk to you. As discussed with you earlier, because of the small size of these polyps, your advanced age and overall low risk of colon cancer in your lifetime, I recommend against any further colon cancer screening.

## 2022-08-01 IMAGING — CT CT HEART MORP W/ CTA COR W/ SCORE W/ CA W/CM &/OR W/O CM
4 of 7 series · 8 of 20 positions shown, 9 images · IV contrast (APPLIED)
Comparison: None.
COMPARISON: None.

Addendum:
EXAM:
OVER-READ INTERPRETATION  CT CHEST

The following report is an over-read performed by radiologist Dr.
over-read does not include interpretation of cardiac or coronary
anatomy or pathology. The coronary CTA interpretation by the
cardiologist is attached.
CLINICAL DATA: 79 year old with dyspnea and chest pain.
Cardiac/Coronary  CTA
TECHNIQUE: The patient was scanned on a Phillips Force scanner.

[Series 6: best diast · axial · 0.38mm/px · z∈[-193,-156]mm · 2 of 273 slices shown, 3 images]
[im 91/273  vessel]
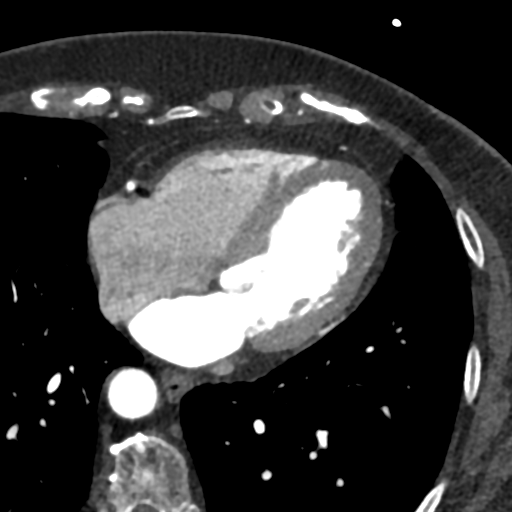
[im 91/273  lung]
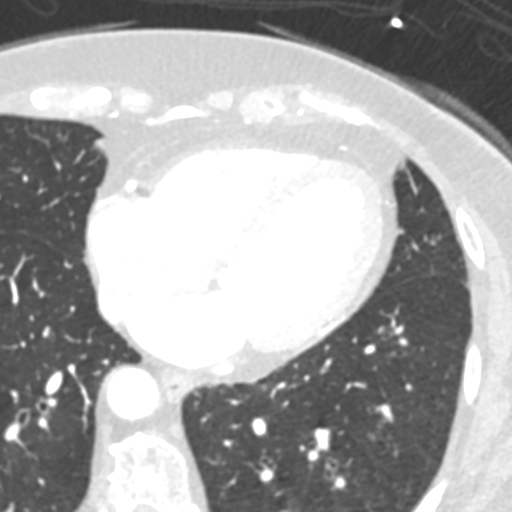
[im 182/273  vessel]
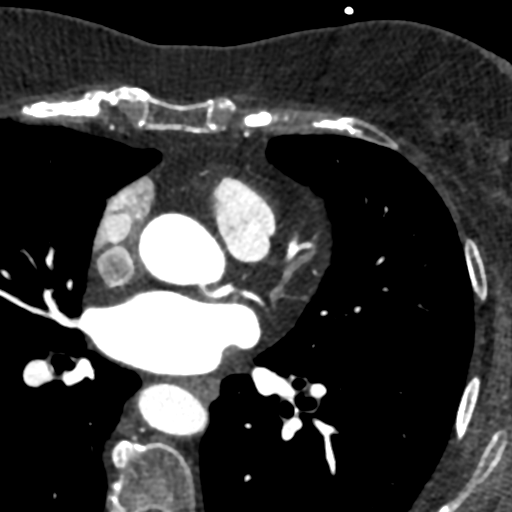

[Series 7: best syst · axial · 0.38mm/px · z∈[-193,-156]mm · 2 of 273 slices shown]
[im 91/273  vessel]
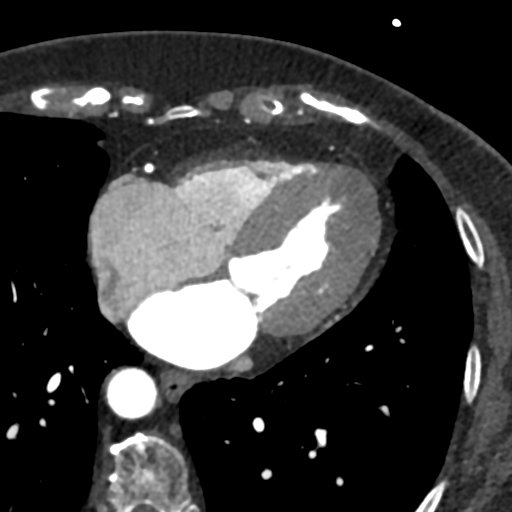
[im 182/273  vessel]
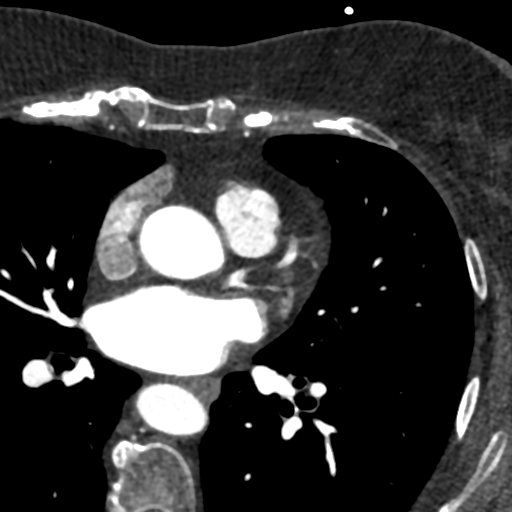

[Series 8: ts diast sharp · axial · 0.38mm/px · z∈[-193,-156]mm · 2 of 273 slices shown]
[im 91/273  lung]
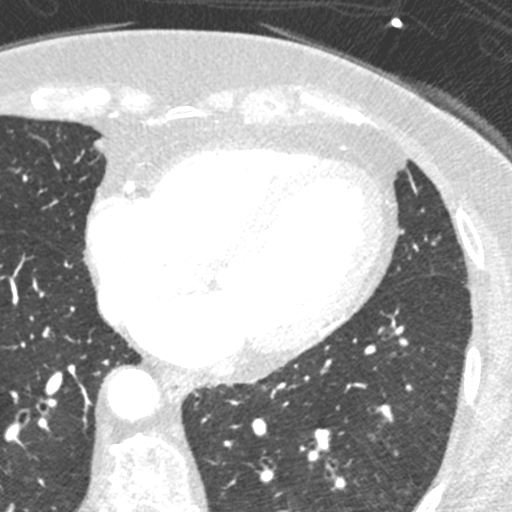
[im 182/273  lung]
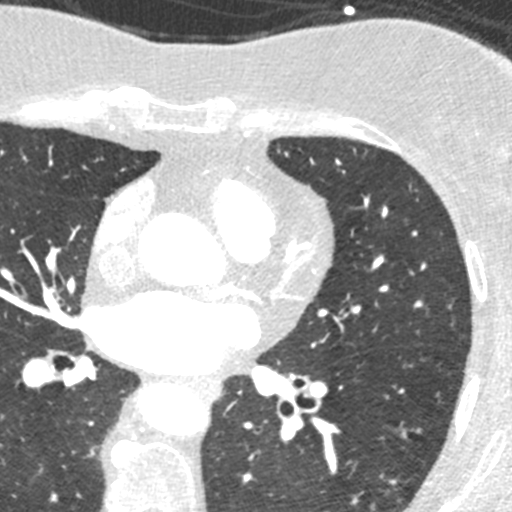

[Series 9: ts syst sharp · axial · 0.38mm/px · z∈[-193,-156]mm · 2 of 273 slices shown]
[im 91/273  lung]
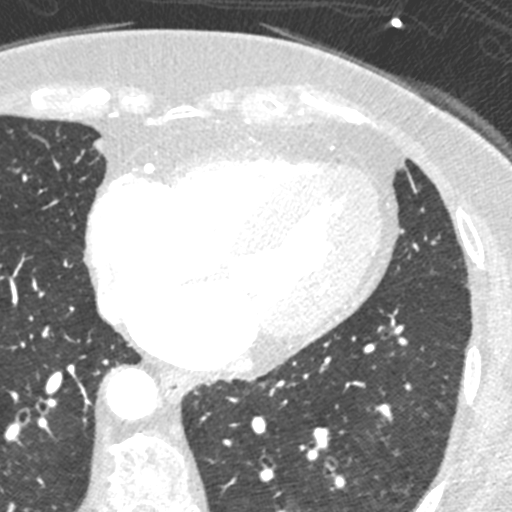
[im 182/273  lung]
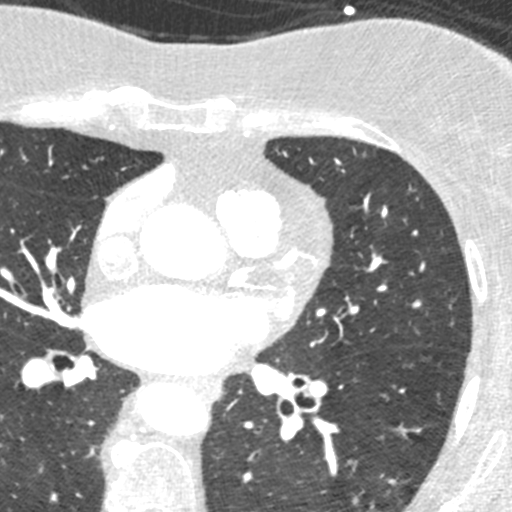

[8 of 20 positions shown; findings below may reference images not displayed]

FINDINGS: Vascular: No incidental findings.

Mediastinum/Nodes: Visualized mediastinum and hilar regions
demonstrate no masses or lymphadenopathy.

Lungs/Pleura: Visualized lungs show no evidence of pulmonary edema,
consolidation, pneumothorax, nodule or pleural fluid.

Upper Abdomen: Unremarkable.

Musculoskeletal: Visualized bony structures are unremarkable.
IMPRESSION: No significant incidental findings.
FINDINGS: A 120 kV prospective scan was triggered in the descending thoracic
aorta at 111 HU's. Axial non-contrast 3 mm slices were carried out
through the heart. The data set was analyzed on a dedicated work
station and scored using the Agatson method. Gantry rotation speed
was 250 msecs and collimation was .6 mm. No beta blockade and 0.8 mg
of sl NTG was given. The 3D data set was reconstructed in 5%
intervals of the 67-82 % of the R-R cycle. Diastolic phases were
analyzed on a dedicated work station using MPR, MIP and VRT modes.
The patient received 80 cc of contrast.

Aorta: Normal size. Focal descending atherosclerosis. No dissection.

Aortic Valve:  Trileaflet.  Mild focal leaflet calcifications.

Coronary Arteries:  Normal coronary origin.  Right dominance.

RCA is a large dominant artery that gives rise to PDA and PLA. There
is no plaque.

Left main is a large artery that gives rise to LAD and LCX arteries.

LAD is a large vessel that has no plaque. Gives rise to 2 moderate
diagonal branches.

LCX is a non-dominant artery that gives rise to one large OM1
branch. There is no plaque.

Other findings:

Normal pulmonary vein drainage into the left atrium.

Normal left atrial appendage without a thrombus.

Normal size of the pulmonary artery.

Please see radiology report for non cardiac findings.
IMPRESSION: 1. Coronary calcium score of 0. This was 0 percentile for age and
sex matched control.

2. Normal coronary origin with right dominance.

3. No evidence of CAD.

4.  Mild focal aortic valve sclerosis.

5.  Mild descending focal aortic atherosclerosis.

*** End of Addendum ***
EXAM:
OVER-READ INTERPRETATION  CT CHEST

The following report is an over-read performed by radiologist Dr.
over-read does not include interpretation of cardiac or coronary
anatomy or pathology. The coronary CTA interpretation by the
cardiologist is attached.
FINDINGS: Vascular: No incidental findings.

Mediastinum/Nodes: Visualized mediastinum and hilar regions
demonstrate no masses or lymphadenopathy.

Lungs/Pleura: Visualized lungs show no evidence of pulmonary edema,
consolidation, pneumothorax, nodule or pleural fluid.

Upper Abdomen: Unremarkable.

Musculoskeletal: Visualized bony structures are unremarkable.
IMPRESSION: No significant incidental findings.

## 2022-11-08 DIAGNOSIS — Z79899 Other long term (current) drug therapy: Secondary | ICD-10-CM | POA: Diagnosis not present

## 2022-11-08 DIAGNOSIS — E1165 Type 2 diabetes mellitus with hyperglycemia: Secondary | ICD-10-CM | POA: Diagnosis not present

## 2022-11-08 DIAGNOSIS — G8929 Other chronic pain: Secondary | ICD-10-CM | POA: Diagnosis not present

## 2022-11-08 DIAGNOSIS — Z0000 Encounter for general adult medical examination without abnormal findings: Secondary | ICD-10-CM | POA: Diagnosis not present

## 2022-11-08 DIAGNOSIS — I10 Essential (primary) hypertension: Secondary | ICD-10-CM | POA: Diagnosis not present

## 2022-11-08 DIAGNOSIS — E785 Hyperlipidemia, unspecified: Secondary | ICD-10-CM | POA: Diagnosis not present

## 2022-11-08 DIAGNOSIS — M545 Low back pain, unspecified: Secondary | ICD-10-CM | POA: Diagnosis not present

## 2022-11-09 DIAGNOSIS — I10 Essential (primary) hypertension: Secondary | ICD-10-CM | POA: Diagnosis not present

## 2022-11-12 DIAGNOSIS — I10 Essential (primary) hypertension: Secondary | ICD-10-CM | POA: Diagnosis not present

## 2023-03-15 DIAGNOSIS — E1165 Type 2 diabetes mellitus with hyperglycemia: Secondary | ICD-10-CM | POA: Diagnosis not present

## 2023-03-15 DIAGNOSIS — Z780 Asymptomatic menopausal state: Secondary | ICD-10-CM | POA: Diagnosis not present

## 2023-03-15 DIAGNOSIS — Z23 Encounter for immunization: Secondary | ICD-10-CM | POA: Diagnosis not present

## 2023-03-15 DIAGNOSIS — I10 Essential (primary) hypertension: Secondary | ICD-10-CM | POA: Diagnosis not present

## 2023-03-15 DIAGNOSIS — D229 Melanocytic nevi, unspecified: Secondary | ICD-10-CM | POA: Diagnosis not present

## 2023-03-15 DIAGNOSIS — Z79899 Other long term (current) drug therapy: Secondary | ICD-10-CM | POA: Diagnosis not present

## 2023-03-15 DIAGNOSIS — E785 Hyperlipidemia, unspecified: Secondary | ICD-10-CM | POA: Diagnosis not present

## 2023-03-18 DIAGNOSIS — D229 Melanocytic nevi, unspecified: Secondary | ICD-10-CM | POA: Diagnosis not present

## 2023-03-18 DIAGNOSIS — E785 Hyperlipidemia, unspecified: Secondary | ICD-10-CM | POA: Diagnosis not present

## 2023-03-18 DIAGNOSIS — I10 Essential (primary) hypertension: Secondary | ICD-10-CM | POA: Diagnosis not present

## 2023-03-18 DIAGNOSIS — Z23 Encounter for immunization: Secondary | ICD-10-CM | POA: Diagnosis not present

## 2023-03-18 DIAGNOSIS — Z780 Asymptomatic menopausal state: Secondary | ICD-10-CM | POA: Diagnosis not present

## 2023-03-18 DIAGNOSIS — E1165 Type 2 diabetes mellitus with hyperglycemia: Secondary | ICD-10-CM | POA: Diagnosis not present

## 2023-03-18 DIAGNOSIS — Z79899 Other long term (current) drug therapy: Secondary | ICD-10-CM | POA: Diagnosis not present

## 2023-04-11 DIAGNOSIS — Z780 Asymptomatic menopausal state: Secondary | ICD-10-CM | POA: Diagnosis not present

## 2023-04-28 DIAGNOSIS — R399 Unspecified symptoms and signs involving the genitourinary system: Secondary | ICD-10-CM | POA: Diagnosis not present

## 2023-05-01 DIAGNOSIS — L814 Other melanin hyperpigmentation: Secondary | ICD-10-CM | POA: Diagnosis not present

## 2023-05-01 DIAGNOSIS — C44319 Basal cell carcinoma of skin of other parts of face: Secondary | ICD-10-CM | POA: Diagnosis not present

## 2023-05-01 DIAGNOSIS — D485 Neoplasm of uncertain behavior of skin: Secondary | ICD-10-CM | POA: Diagnosis not present

## 2023-05-01 DIAGNOSIS — C4441 Basal cell carcinoma of skin of scalp and neck: Secondary | ICD-10-CM | POA: Diagnosis not present

## 2023-05-01 DIAGNOSIS — D229 Melanocytic nevi, unspecified: Secondary | ICD-10-CM | POA: Diagnosis not present

## 2023-05-15 DIAGNOSIS — H40053 Ocular hypertension, bilateral: Secondary | ICD-10-CM | POA: Diagnosis not present

## 2023-05-15 DIAGNOSIS — H2513 Age-related nuclear cataract, bilateral: Secondary | ICD-10-CM | POA: Diagnosis not present

## 2023-05-15 DIAGNOSIS — H524 Presbyopia: Secondary | ICD-10-CM | POA: Diagnosis not present

## 2023-05-15 DIAGNOSIS — H25013 Cortical age-related cataract, bilateral: Secondary | ICD-10-CM | POA: Diagnosis not present

## 2023-05-15 DIAGNOSIS — H5203 Hypermetropia, bilateral: Secondary | ICD-10-CM | POA: Diagnosis not present

## 2023-05-15 DIAGNOSIS — H40033 Anatomical narrow angle, bilateral: Secondary | ICD-10-CM | POA: Diagnosis not present

## 2023-05-15 DIAGNOSIS — H52202 Unspecified astigmatism, left eye: Secondary | ICD-10-CM | POA: Diagnosis not present

## 2023-07-11 DIAGNOSIS — C44319 Basal cell carcinoma of skin of other parts of face: Secondary | ICD-10-CM | POA: Diagnosis not present

## 2023-09-18 ENCOUNTER — Ambulatory Visit (INDEPENDENT_AMBULATORY_CARE_PROVIDER_SITE_OTHER): Payer: Medicare HMO | Admitting: Family Medicine

## 2023-09-18 ENCOUNTER — Encounter: Payer: Self-pay | Admitting: Family Medicine

## 2023-09-18 VITALS — BP 122/74 | HR 73 | Temp 98.2°F | Ht 60.0 in | Wt 145.0 lb

## 2023-09-18 DIAGNOSIS — G8929 Other chronic pain: Secondary | ICD-10-CM

## 2023-09-18 DIAGNOSIS — E119 Type 2 diabetes mellitus without complications: Secondary | ICD-10-CM

## 2023-09-18 DIAGNOSIS — Z6828 Body mass index (BMI) 28.0-28.9, adult: Secondary | ICD-10-CM

## 2023-09-18 DIAGNOSIS — Z79899 Other long term (current) drug therapy: Secondary | ICD-10-CM | POA: Diagnosis not present

## 2023-09-18 DIAGNOSIS — I10 Essential (primary) hypertension: Secondary | ICD-10-CM

## 2023-09-18 DIAGNOSIS — R634 Abnormal weight loss: Secondary | ICD-10-CM | POA: Diagnosis not present

## 2023-09-18 DIAGNOSIS — R0982 Postnasal drip: Secondary | ICD-10-CM | POA: Insufficient documentation

## 2023-09-18 DIAGNOSIS — F419 Anxiety disorder, unspecified: Secondary | ICD-10-CM | POA: Diagnosis not present

## 2023-09-18 DIAGNOSIS — M8580 Other specified disorders of bone density and structure, unspecified site: Secondary | ICD-10-CM | POA: Diagnosis not present

## 2023-09-18 DIAGNOSIS — Z1231 Encounter for screening mammogram for malignant neoplasm of breast: Secondary | ICD-10-CM | POA: Diagnosis not present

## 2023-09-18 DIAGNOSIS — M545 Low back pain, unspecified: Secondary | ICD-10-CM

## 2023-09-18 DIAGNOSIS — E782 Mixed hyperlipidemia: Secondary | ICD-10-CM

## 2023-09-18 MED ORDER — ESCITALOPRAM OXALATE 5 MG PO TABS: 5.0000 mg | ORAL_TABLET | Freq: Every day | ORAL | 1 refills | Status: DC

## 2023-09-18 NOTE — Progress Notes (Signed)
 Patient Office Visit  Assessment & Plan:  Controlled type 2 diabetes mellitus without complication, without long-term current use of insulin (HCC) -     Hemoglobin A1c  HTN (hypertension), benign -     CBC with Differential/Platelet -     COMPLETE METABOLIC PANEL WITH GFR  Mixed hyperlipidemia -     Lipid panel  Chronic bilateral low back pain without sciatica -     VITAMIN D 25 Hydroxy (Vit-D Deficiency, Fractures)  Weight loss -     TSH  Postnasal drip  Taking a statin medication -     COMPLETE METABOLIC PANEL WITH GFR  Anxiety -     Escitalopram Oxalate; Take 1 tablet (5 mg total) by mouth daily.  Dispense: 90 tablet; Refill: 1  Visit for screening mammogram -     3D Screening Mammogram, Left and Right; Future  Osteopenia, unspecified location   Test results were reviewed and analyzed as part of the medical decision making of this visit.  [facility] and [facility] work.  Reviewed previous DEXA scan during the office visit.  Follow-up on lab work notify patient.  3D mammogram ordered at [facility] in [facility].  Patient does not need a DEXA scan right now.  Start Lexapro 5 mg once a day return in 4 to 6 weeks or sooner if necessary. Recommend healthy diet i.e mediterranean/DASH diet, consistent exercise - 30 minutes 5 day per week, and gradual weight loss.  Return in about 4 weeks (around 10/16/2023), or if symptoms worsen or fail to improve, for depression.   Subjective:    Patient ID: Gabrielle Doyle, female    DOB: September 21, 1940  Age: 83 y.o. MRN: [id number]  No chief complaint on file.   HPI Patient is here to establish care and discuss chronic medical issues. HTN-using antihypertensive medication without difficulty.  Denies associated signs and symptoms including chest pain, shortness of breath, cough headache, peripheral swelling cramps spasms and palpitations.  Voices understanding of the potential for interference with blood pressure  control with substances including high sodium intake, decongestions, herbal supplements weight loss supplements nutritional supplements.  Blood pressures at home are less than 140/90.   Hyperlipidemia-denies unusual muscle aches or muscle cramps or difficulty tolerating statin therapy.  Aware of need for diet control, exercise and healthy eating.  Patient is aware not to consume grapefruit juice or pomegranate juice. Type 2 diabetes-previously controlled.  Denies diarrhea, peripheral swelling, hypoglycemia, excessive thirst, excessive urination, visual fluctuation, and fatigue.  Making an effort on diet control and exercise.  Has an up-to-date dilated eye exam.  Using medication as prescribed without difficulty.  [name] weight remains stable but has lost a little bit of weight since last OV.  Patient does eat healthy for the most part and stays active.  Will see her eye doctor in [facility]. Anxiety-patient's husband passed in December.  Patient has been grieving but also is getting more agitated and anxious in the last couple of months.  Daughter has noticed this.  Patient thinks he is doing okay but may need medication to help her during this process.  Patient has good support daughter lives nearby and son-in-law. Chronic low back pain-patient does walk her dog on a daily basis.  Patient does not want to do any physical therapy. Osteopenia-patient did have a bone density last year.  Patient does take calcium vitamin D and is aware that weightbearing exercises i.e. walking and strength training are good for her. Health  maintenance-patient is not up-to-date on her mammogram but will do this at [facility].  Patient is up-to-date on colonoscopies and does not need anymore.  Patient had a DEXA scan. The ASCVD Risk score (Arnett DK, et al., 2019) failed to calculate for the following reasons:   The 2019 ASCVD risk score is only valid for ages 61 to 26  Past Medical History:  Diagnosis Date   Allergy     Chest pain    cath clean 2013.  Presumed esophageal pain.   Diabetes mellitus, type 2 (HCC)    Dyslipidemia    Fibromyalgia    GERD (gastroesophageal reflux disease)    Hay fever    seasonal--fall esp   HTN (hypertension), benign 04/07/2012   Hyperlipidemia 04/07/2012   Hypertension    Leg pain, bilateral    Questionable diagnosis of fibromyalgia in past (per pt); some vague leg pains, lyrica was tried and gave her side effect so this dx  consideration has really been dropped.   Osteoarthritis    hands, light--"legs" nonspecific--tylenol is sufficient for her.   Osteopenia 08/2011   T score avg 2   Polymyalgia rheumatica (HCC) 2000   took prednisone for about 2 yrs.   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   CARDIOVASCULAR STRESS TEST  12/2011   No ischemia; hypertensive/tachycardic response.  Normal LV  syst fxn, 88%. EF.   CHOLECYSTECTOMY  1980   COLONOSCOPY  09/2008   normal (Dr. Cleotis Nipper, MD),  Repeat TCS 2020.    TOTAL ABDOMINAL HYSTERECTOMY  1970s   non-malignant reasons; metromenorrhagia   Social History   Tobacco Use   Smoking status: Never   Smokeless tobacco: Never  Vaping Use   Vaping status: Never Used  Substance Use Topics   Alcohol use: Yes    Alcohol/week: 10.0 standard drinks of alcohol    Types: 10 Shots of liquor per week    Comment: fireball in the morning   Drug use: No   Family History  Problem Relation Age of Onset   Hyperlipidemia Mother    Stroke Mother    Heart disease Mother    Hypertension Mother    Diabetes Mother    Hyperlipidemia Father    Heart disease Father    Hypertension Father    Diabetes Sister    Diabetes  Brother    Heart attack Brother    Diabetes Daughter    Hypertension Daughter    Hypertension Daughter    Colon cancer Neg Hx    Colon polyps Neg Hx    Esophageal cancer Neg Hx    Rectal cancer Neg Hx    Stomach cancer Neg Hx    Allergies  Allergen Reactions   Celebrex [Celecoxib] Nausea And Vomiting    "Almost had a stroke"; syncope, slept all the time "Almost had a stroke"; syncope, slept all the time    Lisinopril Nausea And Vomiting   Lyrica [Pregabalin] Other (See Comments)    Hyponatremia    ROS    Objective:    BP 122/74   Pulse 73   Temp 98.2 F (36.8 C)   Ht 5' (1.524 m)   Wt 145 lb (  65.8 kg)   SpO2 99%   BMI 28.32 kg/m  BP Readings from Last 3 Encounters:  09/18/23 122/74  07/17/22 114/70  07/01/22 (!) 152/89   Wt Readings from Last 3 Encounters:  09/18/23 145 lb (65.8 kg)  07/17/22 150 lb (68 kg)  07/01/22 149 lb 14.6 oz (68 kg)    Physical Exam Vitals and nursing note reviewed.  Constitutional:      Appearance: Normal appearance.     Comments: Patient comes in with her daughter  HENT:     Head: Normocephalic.     Right Ear: Tympanic membrane, ear canal and external ear normal.     Left Ear: Tympanic membrane, ear canal and external ear normal.  Eyes:     Extraocular Movements: Extraocular movements intact.     Conjunctiva/sclera: Conjunctivae normal.     Pupils: Pupils are equal, round, and reactive to light.  Cardiovascular:     Rate and Rhythm: Normal rate and regular rhythm.     Heart sounds: Normal heart sounds.  Pulmonary:     Effort: Pulmonary effort is normal.     Breath sounds: Normal breath sounds.  Musculoskeletal:     Lumbar back: Tenderness present. No scoliosis.     Right lower leg: No edema.     Left lower leg: No edema.  Neurological:     General: No focal deficit present.     Mental Status: She is alert and oriented to person, place, and time.  Psychiatric:        Mood and Affect: Mood normal.        Behavior:  Behavior normal.        Thought Content: Thought content normal.        Judgment: Judgment normal.      No results found for any visits on 09/18/23.

## 2023-09-19 LAB — COMPLETE METABOLIC PANEL WITH GFR
AG Ratio: 1.8 (calc) (ref 1.0–2.5)
ALT: 17 U/L (ref 6–29)
AST: 26 U/L (ref 10–35)
Albumin: 4.1 g/dL (ref 3.6–5.1)
Alkaline phosphatase (APISO): 36 U/L — ABNORMAL LOW (ref 37–153)
BUN: 13 mg/dL (ref 7–25)
CO2: 30 mmol/L (ref 20–32)
Calcium: 9.7 mg/dL (ref 8.6–10.4)
Chloride: 101 mmol/L (ref 98–110)
Creat: 0.73 mg/dL (ref 0.60–0.95)
Globulin: 2.3 g/dL (ref 1.9–3.7)
Glucose, Bld: 118 mg/dL — ABNORMAL HIGH (ref 65–99)
Potassium: 4.4 mmol/L (ref 3.5–5.3)
Sodium: 139 mmol/L (ref 135–146)
Total Bilirubin: 0.6 mg/dL (ref 0.2–1.2)
Total Protein: 6.4 g/dL (ref 6.1–8.1)

## 2023-09-19 LAB — CBC WITH DIFFERENTIAL/PLATELET
Absolute Lymphocytes: 2298 {cells}/uL (ref 850–3900)
Absolute Monocytes: 509 {cells}/uL (ref 200–950)
Basophils Absolute: 67 {cells}/uL (ref 0–200)
Basophils Relative: 1 %
Eosinophils Absolute: 221 {cells}/uL (ref 15–500)
Eosinophils Relative: 3.3 %
HCT: 40.9 % (ref 35.0–45.0)
Hemoglobin: 13.2 g/dL (ref 11.7–15.5)
MCH: 30.6 pg (ref 27.0–33.0)
MCHC: 32.3 g/dL (ref 32.0–36.0)
MCV: 94.7 fL (ref 80.0–100.0)
MPV: 10.1 fL (ref 7.5–12.5)
Monocytes Relative: 7.6 %
Neutro Abs: 3605 {cells}/uL (ref 1500–7800)
Neutrophils Relative %: 53.8 %
Platelets: 195 10*3/uL (ref 140–400)
RBC: 4.32 10*6/uL (ref 3.80–5.10)
RDW: 12.1 % (ref 11.0–15.0)
Total Lymphocyte: 34.3 %
WBC: 6.7 10*3/uL (ref 3.8–10.8)

## 2023-09-19 LAB — LIPID PANEL
Cholesterol: 125 mg/dL (ref <200)
HDL: 49 mg/dL — ABNORMAL LOW (ref >=50)
LDL Cholesterol (Calc): 52 mg/dL
Non-HDL Cholesterol (Calc): 76 mg/dL (ref <130)
Total CHOL/HDL Ratio: 2.6 (calc) (ref <5.0)
Triglycerides: 164 mg/dL — ABNORMAL HIGH (ref <150)

## 2023-09-19 LAB — HEMOGLOBIN A1C
Hgb A1c MFr Bld: 6.4 %{Hb} — ABNORMAL HIGH (ref <5.7)
Mean Plasma Glucose: 137 mg/dL
eAG (mmol/L): 7.6 mmol/L

## 2023-09-19 LAB — VITAMIN D 25 HYDROXY (VIT D DEFICIENCY, FRACTURES): Vit D, 25-Hydroxy: 61 ng/mL (ref 30–100)

## 2023-09-19 LAB — TSH: TSH: 0.89 m[IU]/L (ref 0.40–4.50)

## 2023-09-20 ENCOUNTER — Encounter: Payer: Self-pay | Admitting: Family Medicine

## 2023-09-23 DIAGNOSIS — Z1231 Encounter for screening mammogram for malignant neoplasm of breast: Secondary | ICD-10-CM | POA: Diagnosis not present

## 2023-11-11 ENCOUNTER — Encounter (HOSPITAL_COMMUNITY): Payer: Self-pay

## 2023-11-18 ENCOUNTER — Encounter: Payer: Self-pay | Admitting: Family Medicine

## 2023-11-18 ENCOUNTER — Ambulatory Visit: Admitting: Family Medicine

## 2023-11-18 ENCOUNTER — Other Ambulatory Visit: Payer: Self-pay

## 2023-11-18 VITALS — BP 120/60 | HR 68 | Temp 98.2°F | Ht 60.0 in | Wt 143.1 lb

## 2023-11-18 DIAGNOSIS — E782 Mixed hyperlipidemia: Secondary | ICD-10-CM | POA: Diagnosis not present

## 2023-11-18 DIAGNOSIS — D239 Other benign neoplasm of skin, unspecified: Secondary | ICD-10-CM | POA: Diagnosis not present

## 2023-11-18 DIAGNOSIS — I10 Essential (primary) hypertension: Secondary | ICD-10-CM

## 2023-11-18 DIAGNOSIS — R5383 Other fatigue: Secondary | ICD-10-CM | POA: Diagnosis not present

## 2023-11-18 DIAGNOSIS — E1165 Type 2 diabetes mellitus with hyperglycemia: Secondary | ICD-10-CM | POA: Diagnosis not present

## 2023-11-18 DIAGNOSIS — F419 Anxiety disorder, unspecified: Secondary | ICD-10-CM | POA: Diagnosis not present

## 2023-11-18 MED ORDER — BLOOD GLUCOSE TEST VI STRP: 1.0000 | ORAL_STRIP | Freq: Three times a day (TID) | 0 refills | Status: AC

## 2023-11-18 MED ORDER — BLOOD GLUCOSE MONITORING SUPPL DEVI
1.0000 | Freq: Three times a day (TID) | 0 refills | Status: AC
Start: 2023-11-18 — End: ?

## 2023-11-18 MED ORDER — LANCETS MISC. MISC: 1.0000 | Freq: Three times a day (TID) | 0 refills | Status: AC

## 2023-11-18 MED ORDER — LANCET DEVICE MISC: 1.0000 | Freq: Three times a day (TID) | 0 refills | Status: AC

## 2023-11-18 NOTE — Progress Notes (Addendum)
 Patient Office Visit  Assessment & Plan:  Type 2 diabetes mellitus with hyperglycemia, without long-term current use of insulin (HCC)  Mixed hyperlipidemia  Hypertension, unspecified type  Anxiety  Fatigue, unspecified type  Dysplastic nevus -     Ambulatory referral to Dermatology   Assessment and Plan Assessment & Plan Sleep disturbance Nocturnal awakenings with prolonged wakefulness. Escitalopram 's efficacy on sleep has decreased. Daytime naps may worsen sleep issues. Discussed melatonin as a variable sleep aid. - Trial of melatonin for sleep aid.  Depression Managed with escitalopram  5 mg at night. Initial improvement noted, but recent efficacy decreased. Daughter reports mood improvement. Patient hesitant to adjust medication. Escitalopram  is non-addictive and does not alter personality. - Continue escitalopram  5 mg at night.  Constipation Likely due to insufficient dietary intake. Discussed importance of balanced meals with adequate fiber. - Increase dietary intake, focusing on balanced meals with adequate fiber.  Skin lesion requiring removal/dysplastic Nevus chest skin lesion with changes in appearance. Not suspected malignant but removal recommended since increase in size.  Discussed need for dermatological evaluation. - Refer to dermatologist for evaluation and removal of skin lesion. Fatigue- we can do labs next time including iron panel. Increase dietary intake including protein             No follow-ups on file.   Subjective:     Patient ID: Gabrielle Doyle, female    DOB: 1941-04-24  Age: 83 y.o. MRN: [id number]  Chief Complaint  Patient presents with   Medical Management of Chronic Issues    HPI Discussed the use of AI scribe software for clinical note transcription with the patient, who gave verbal consent to proceed. Gabrielle Doyle is an 83 year old female who presents with sleep disturbances and appetite issues. She is accompanied  by her daughter.  She has been experiencing sleep disturbances for the past two weeks, characterized by nocturia and difficulty returning to sleep. During these periods, she engages in activities such as watching TV and playing Solitaire, which further disrupt her sleep. She occasionally naps during the day for about an hour to an hour and a half, which may contribute to her nighttime sleep issues. She has been taking escitalopram  5 mg at night; her daughter noted that it seemed to help her sleep initially, but she does not feel a significant change.  Her appetite is poor, with her daughter noting that she often nibbles rather than eating full meals. She typically consumes small portions, such as a Nutri-Grain bar for breakfast and half a pimento cheese sandwich for dinner. She enjoys sweets and snacks, including little pies and Pop-Tarts. Despite this, her weight has decreased slightly from 145 lbs to 143 lbs since her last visit. She experiences constipation, which may be related to her low food intake, but denies seeing blood in her stools. She has started using Ensure to supplement her diet and increase protein intake.  She denies feeling depressed or anxious, and her daughter observes that she seems less edgy since starting escitalopram , although she does not feel a significant change. She is generally active, engaging in activities such as walking and gardening. She lives close to her daughter, who frequently assists her with meals and activities. She values her independence but is supported by her daughter in maintaining her health and daily routines. Physical Exam MEASUREMENTS: Weight- 143. CHEST: Lungs clear to auscultation bilaterally. Results LABS A1c: within normal limits (March 2025) Thyroid function tests: within normal limits (March 2025)  PATHOLOGY Seborrheic  keratosis: removed (January 2025) Assessment & Plan Sleep disturbance Nocturnal awakenings with prolonged wakefulness.  Escitalopram 's efficacy on sleep has decreased. Daytime naps may worsen sleep issues. Discussed melatonin as a variable sleep aid. - Trial of melatonin for sleep aid.  Depression/anxiety Managed with escitalopram  5 mg at night. Initial improvement noted, but recent efficacy decreased. Daughter reports mood improvement. Patient hesitant to adjust medication. Escitalopram  is non-addictive and does not alter personality. - Continue escitalopram  5 mg at night.  Constipation Likely due to insufficient dietary intake. Discussed importance of balanced meals with adequate fiber. - Increase dietary intake, focusing on balanced meals with adequate fiber.  Skin lesion requiring removal/atypical nevus Right sided chest skin lesion with changes in appearance. Not suspected malignant but removal recommended. Discussed need for dermatological evaluation. - Refer to dermatologist for evaluation and removal of skin lesion.           The ASCVD Risk score (Arnett DK, et al., 2019) failed to calculate for the following reasons:   The 2019 ASCVD risk score is only valid for ages 38 to 16  Past Medical History:  Diagnosis Date   Allergy    Chest pain    cath clean 2013.  Presumed esophageal pain.   Diabetes mellitus, type 2 (HCC)    Dyslipidemia    Fibromyalgia    GERD (gastroesophageal reflux disease)    Hay fever    seasonal--fall esp   HTN (hypertension), benign 04/07/2012   Hyperlipidemia 04/07/2012   Hypertension    Leg pain, bilateral    Questionable diagnosis of fibromyalgia in past (per pt); some vague leg pains, lyrica was tried and gave her side effect so this dx  consideration has really been dropped.   Osteoarthritis    hands, light--"legs" nonspecific--tylenol  is sufficient for her.   Osteopenia 08/2011   T score avg 2   Polymyalgia rheumatica (HCC) 2000   took prednisone for about 2 yrs.   Postmenopausal atrophic vaginitis    Postmenopause atrophic vaginitis 08/05/2012   Type  II or unspecified type diabetes mellitus without mention of complication, not stated as uncontrolled 04/07/2012   Past Surgical History:  Procedure Laterality Date   BREAST BIOPSY     x 2, left   CARDIAC CATHETERIZATION  12/2011   Smooth coronaries and normal LV fxn.   CARDIOVASCULAR STRESS TEST  12/2011   No ischemia; hypertensive/tachycardic response.  Normal LV  syst fxn, 88%. EF.   CHOLECYSTECTOMY  1980   COLONOSCOPY  09/2008   normal (Dr. Florette Hurry, MD),  Repeat TCS 2020.    TOTAL ABDOMINAL HYSTERECTOMY  1970s   non-malignant reasons; metromenorrhagia   Social History   Tobacco Use   Smoking status: Never   Smokeless tobacco: Never  Vaping Use   Vaping status: Never Used  Substance Use Topics   Alcohol use: Yes    Alcohol/week: 10.0 standard drinks of alcohol    Types: 10 Shots of liquor per week    Comment: fireball in the morning   Drug use: No   Family History  Problem Relation Age of Onset   Hyperlipidemia Mother    Stroke Mother    Heart disease Mother    Hypertension Mother    Diabetes Mother    Hyperlipidemia Father    Heart disease Father    Hypertension Father    Diabetes Sister    Diabetes Brother    Heart attack Brother    Diabetes Daughter    Hypertension Daughter    Hypertension Daughter  Colon cancer Neg Hx    Colon polyps Neg Hx    Esophageal cancer Neg Hx    Rectal cancer Neg Hx    Stomach cancer Neg Hx    Allergies  Allergen Reactions   Celebrex [Celecoxib] Nausea And Vomiting    "Almost had a stroke"; syncope, slept all the time "Almost had a stroke"; syncope, slept all the time    Lisinopril Nausea And Vomiting   Lyrica [Pregabalin] Other (See Comments)    Hyponatremia    ROS    Objective:    BP 120/60   Pulse 68   Temp 98.2 F (36.8 C)   Ht 5' (1.524 m)   Wt 143 lb 2 oz (64.9 kg)   SpO2 99%   BMI 27.95 kg/m  BP Readings from Last 3 Encounters:  11/18/23 120/60  09/18/23 122/74  07/17/22 114/70   Wt Readings from  Last 3 Encounters:  11/18/23 143 lb 2 oz (64.9 kg)  09/18/23 145 lb (65.8 kg)  07/17/22 150 lb (68 kg)    Physical Exam Vitals and nursing note reviewed.  Constitutional:      Appearance: Normal appearance.  HENT:     Head: Normocephalic.     Right Ear: Tympanic membrane, ear canal and external ear normal.     Left Ear: Tympanic membrane, ear canal and external ear normal.  Eyes:     Extraocular Movements: Extraocular movements intact.     Pupils: Pupils are equal, round, and reactive to light.  Cardiovascular:     Rate and Rhythm: Normal rate and regular rhythm.     Heart sounds: Normal heart sounds.  Pulmonary:     Effort: Pulmonary effort is normal.     Breath sounds: Normal breath sounds.  Musculoskeletal:     Right lower leg: No edema.     Left lower leg: No edema.  Skin:    Findings: Lesion present.     Comments: Raised 4-5 mm SK vs other with pigmented spot in the middle.   Neurological:     General: No focal deficit present.     Mental Status: She is alert and oriented to person, place, and time.  Psychiatric:        Mood and Affect: Mood normal.        Behavior: Behavior normal.        Thought Content: Thought content normal.        Judgment: Judgment normal.      No results found for any visits on 11/18/23.

## 2024-01-08 ENCOUNTER — Ambulatory Visit

## 2024-01-13 ENCOUNTER — Encounter: Payer: Self-pay | Admitting: Family Medicine

## 2024-01-13 ENCOUNTER — Other Ambulatory Visit: Payer: Self-pay | Admitting: Family Medicine

## 2024-01-13 ENCOUNTER — Other Ambulatory Visit: Payer: Self-pay

## 2024-01-13 MED ORDER — LOSARTAN POTASSIUM 50 MG PO TABS: 50.0000 mg | ORAL_TABLET | Freq: Every day | ORAL | 1 refills | Status: DC

## 2024-01-13 NOTE — Telephone Encounter (Unsigned)
 Copied from CRM [id number]. Topic: Clinical - Medication Refill >> Jan 13, 2024 10:14 AM Montie POUR wrote: Medication: losartan  (COZAAR ) 50 MG tablet   Has the patient contacted their pharmacy? Yes (Agent: If no, request that the patient contact the pharmacy for the refill. If patient does not wish to contact the pharmacy document the reason why and proceed with request.) (Agent: If yes, when and what did the pharmacy advise?) Pharmacy needs order to refill  This is the patient's preferred pharmacy:  [id number] - [place], [place] - [id number] [place] [address] Phone: [phone] Fax: [phone]  Is this the correct pharmacy for this prescription? Yes If no, delete pharmacy and type the correct one.   Has the prescription been filled recently? No  Is the patient out of the medication? Yes She has been out for 2 days  Has the patient been seen for an appointment in the last year OR does the patient have an upcoming appointment? Yes  Can we respond through MyChart? No  Agent: Please be advised that Rx refills may take up to 3 business days. We ask that you follow-up with your pharmacy.

## 2024-01-27 ENCOUNTER — Encounter: Payer: Self-pay | Admitting: Family Medicine

## 2024-01-27 ENCOUNTER — Ambulatory Visit: Admitting: Family Medicine

## 2024-01-27 VITALS — BP 124/68 | HR 69 | Temp 98.5°F | Ht 60.0 in | Wt 142.0 lb

## 2024-01-27 DIAGNOSIS — Z79899 Other long term (current) drug therapy: Secondary | ICD-10-CM

## 2024-01-27 DIAGNOSIS — G4700 Insomnia, unspecified: Secondary | ICD-10-CM

## 2024-01-27 DIAGNOSIS — I10 Essential (primary) hypertension: Secondary | ICD-10-CM

## 2024-01-27 DIAGNOSIS — M545 Low back pain, unspecified: Secondary | ICD-10-CM

## 2024-01-27 DIAGNOSIS — E1165 Type 2 diabetes mellitus with hyperglycemia: Secondary | ICD-10-CM

## 2024-01-27 DIAGNOSIS — E782 Mixed hyperlipidemia: Secondary | ICD-10-CM

## 2024-01-27 DIAGNOSIS — G8929 Other chronic pain: Secondary | ICD-10-CM

## 2024-01-27 NOTE — Progress Notes (Signed)
 Patient Office Visit  Assessment & Plan:  Hypertension, unspecified type -     CBC with Differential/Platelet -     Comprehensive metabolic panel with GFR  Taking a statin medication -     Comprehensive metabolic panel with GFR  Mixed hyperlipidemia -     Lipid panel  Chronic bilateral low back pain without sciatica  Type 2 diabetes mellitus with hyperglycemia, without long-term current use of insulin (HCC) -     Hemoglobin A1c -     Microalbumin / creatinine urine ratio  Insomnia, unspecified type   Assessment and Plan    Type 2 Diabetes Mellitus well controlled A1c improved to 6.4. Maintains weight, good appetite, and healthy diet. - Check A1c level and fasting blood sugar.  Depressive symptoms Reports mild symptoms. Engages in outdoor work. has good support  Sleep disturbance Difficulty sleeping at night, possibly due to daytime naps.  Chronic Back pain Intermittent pain likely due to arthritis. Relief with topical analgesic cream.  General Health Maintenance Up to date on screenings and vaccinations. Maintains healthy lifestyle. - Check blood work including anemia, kidney function, liver function, cholesterol, and proteinuria.          Return in about 3 months (around 04/28/2024), or if symptoms worsen or fail to improve, for hypertension, hyperlipidemia, type 2 diabetes.   Subjective:    Patient ID: Gabrielle Doyle, female    DOB: February 20, 1941  Age: 83 y.o. MRN: [id number]  Chief Complaint  Patient presents with   Medical Management of Chronic Issues    HPI Discussed the use of AI scribe software for clinical note transcription with the patient, who gave verbal consent to proceed.  History of Present Illness        Gabrielle Doyle is an 83 year old female who presents for a routine follow-up visit.  She feels somewhat down since husband's passing but remains active with outdoor activities such as yard work and gardening. Due to the heat,  she has been staying indoors more, managing her activities by working in the morning and late evening. No severe depressive symptoms are reported. Patient has difficulty with insomnia 50% of the time but does not want to take any  medication for this.   Her appetite is described as 'pretty good,' with a typical intake of three to four meals a day, primarily drinking water. She maintains a stable weight. Sleep is inconsistent, with some nights being restful and others not, attributed to daytime naps lasting up to two hours.  She does not clearly remember a past episode of shortness of breath that led to a cardiology evaluation in 2021, during which a calcium  score test was performed. Details of this event are not clearly remembered.  She experiences intermittent back pain, managed with a topical cream. The pain is not worsening and does not interfere with her sleep. type 2 Diabetes- eye exam is UTD. patient does not check her glucose levels at home.  She lives alone and cooks for herself, often using vegetables from her garden. She plans to go to the beach with her daughters and sister at the end of August. No recent falls and no issues with her back pain affecting her sleep. Physical Exam CHEST: Lungs clear to auscultation bilaterally. Results LABS   A1c: 6.4  DIAGNOSTIC   Coronary Artery Calcium  Score: No plaque (10/2019) Assessment & Plan Type 2 Diabetes Mellitus well controlled A1c improved to 6.4. Maintains weight, good appetite, and healthy diet. - Check  A1c level and fasting blood sugar.  Depressive symptoms Reports mild symptoms. Engages in outdoor work. has good support  Sleep disturbance Difficulty sleeping at night, possibly due to daytime naps.  Chronic Back pain Intermittent pain likely due to arthritis. Relief with topical analgesic cream.  General Health Maintenance Up to date on screenings and vaccinations. Maintains healthy lifestyle. - Check blood work including  anemia, kidney function, liver function, cholesterol, and proteinuria.     The ASCVD Risk score (Arnett DK, et al., 2019) failed to calculate for the following reasons:   The 2019 ASCVD risk score is only valid for ages 23 to 41  Past Medical History:  Diagnosis Date   Allergy    Chest pain    cath clean 2013.  Presumed esophageal pain.   Diabetes mellitus, type 2 (HCC)    Dyslipidemia    Fibromyalgia    GERD (gastroesophageal reflux disease)    Hay fever    seasonal--fall esp   HTN (hypertension), benign 04/07/2012   Hyperlipidemia 04/07/2012   Hypertension    Leg pain, bilateral    Questionable diagnosis of fibromyalgia in past (per pt); some vague leg pains, lyrica was tried and gave her side effect so this dx  consideration has really been dropped.   Osteoarthritis    hands, light--legs nonspecific--tylenol  is sufficient for her.   Osteopenia 08/2011   T score avg 2   Polymyalgia rheumatica (HCC) 2000   took prednisone for about 2 yrs.   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   CARDIOVASCULAR STRESS TEST  12/2011   No ischemia; hypertensive/tachycardic response.  Normal LV  syst fxn, 88%. EF.   CHOLECYSTECTOMY  1980   COLONOSCOPY  09/2008   normal (Dr. Barry, MD),  Repeat TCS 2020.    TOTAL ABDOMINAL HYSTERECTOMY  1970s   non-malignant reasons; metromenorrhagia   Social History   Tobacco Use   Smoking status: Never   Smokeless tobacco: Never  Vaping Use   Vaping status: Never Used  Substance Use Topics   Alcohol use: Yes    Alcohol/week: 10.0 standard drinks of alcohol    Types: 10 Shots of liquor per week    Comment: fireball in the  morning   Drug use: No   Family History  Problem Relation Age of Onset   Hyperlipidemia Mother    Stroke Mother    Heart disease Mother    Hypertension Mother    Diabetes Mother    Hyperlipidemia Father    Heart disease Father    Hypertension Father    Diabetes Sister    Diabetes Brother    Heart attack Brother    Diabetes Daughter    Hypertension Daughter    Hypertension Daughter    Colon cancer Neg Hx    Colon polyps Neg Hx    Esophageal cancer Neg Hx    Rectal cancer Neg Hx    Stomach cancer Neg Hx    Allergies  Allergen Reactions   Celebrex [Celecoxib] Nausea And Vomiting    Almost had a stroke; syncope, slept all the time Almost had a stroke; syncope, slept all the time    Lisinopril Nausea And Vomiting  Lyrica [Pregabalin] Other (See Comments)    Hyponatremia    ROS    Objective:    BP 124/68   Pulse 69   Temp 98.5 F (36.9 C)   Ht 5' (1.524 m)   Wt 142 lb (64.4 kg)   SpO2 98%   BMI 27.73 kg/m  BP Readings from Last 3 Encounters:  01/27/24 124/68  11/18/23 120/60  09/18/23 122/74   Wt Readings from Last 3 Encounters:  01/27/24 142 lb (64.4 kg)  11/18/23 143 lb 2 oz (64.9 kg)  09/18/23 145 lb (65.8 kg)    Physical Exam Vitals and nursing note reviewed.  Constitutional:      Appearance: Normal appearance.  HENT:     Head: Normocephalic.     Right Ear: Tympanic membrane, ear canal and external ear normal.     Left Ear: Tympanic membrane, ear canal and external ear normal.  Eyes:     Extraocular Movements: Extraocular movements intact.     Conjunctiva/sclera: Conjunctivae normal.     Pupils: Pupils are equal, round, and reactive to light.  Cardiovascular:     Rate and Rhythm: Normal rate and regular rhythm.     Heart sounds: Normal heart sounds.  Pulmonary:     Effort: Pulmonary effort is normal.     Breath sounds: Normal breath sounds.  Musculoskeletal:     Right lower leg: No edema.     Left lower leg: No edema.  Neurological:      General: No focal deficit present.     Mental Status: She is alert and oriented to person, place, and time.  Psychiatric:        Mood and Affect: Mood normal.        Behavior: Behavior normal.        Thought Content: Thought content normal.        Judgment: Judgment normal.      No results found for any visits on 01/27/24.

## 2024-01-28 ENCOUNTER — Ambulatory Visit: Payer: Self-pay | Admitting: Family Medicine

## 2024-01-28 LAB — CBC WITH DIFFERENTIAL/PLATELET
Absolute Lymphocytes: 2670 {cells}/uL (ref 850–3900)
Absolute Monocytes: 675 {cells}/uL (ref 200–950)
Basophils Absolute: 43 {cells}/uL (ref 0–200)
Basophils Relative: 0.6 %
Eosinophils Absolute: 163 {cells}/uL (ref 15–500)
Eosinophils Relative: 2.3 %
HCT: 40.5 % (ref 35.0–45.0)
Hemoglobin: 13.2 g/dL (ref 11.7–15.5)
MCH: 31.5 pg (ref 27.0–33.0)
MCHC: 32.6 g/dL (ref 32.0–36.0)
MCV: 96.7 fL (ref 80.0–100.0)
MPV: 10.5 fL (ref 7.5–12.5)
Monocytes Relative: 9.5 %
Neutro Abs: 3550 {cells}/uL (ref 1500–7800)
Neutrophils Relative %: 50 %
Platelets: 172 Thousand/uL (ref 140–400)
RBC: 4.19 Million/uL (ref 3.80–5.10)
RDW: 12.1 % (ref 11.0–15.0)
Total Lymphocyte: 37.6 %
WBC: 7.1 Thousand/uL (ref 3.8–10.8)

## 2024-01-28 LAB — COMPREHENSIVE METABOLIC PANEL WITH GFR
AG Ratio: 1.6 (calc) (ref 1.0–2.5)
ALT: 15 U/L (ref 6–29)
AST: 23 U/L (ref 10–35)
Albumin: 4.2 g/dL (ref 3.6–5.1)
Alkaline phosphatase (APISO): 33 U/L — ABNORMAL LOW (ref 37–153)
BUN: 10 mg/dL (ref 7–25)
CO2: 28 mmol/L (ref 20–32)
Calcium: 9.9 mg/dL (ref 8.6–10.4)
Chloride: 102 mmol/L (ref 98–110)
Creat: 0.67 mg/dL (ref 0.60–0.95)
Globulin: 2.6 g/dL (ref 1.9–3.7)
Glucose, Bld: 117 mg/dL — ABNORMAL HIGH (ref 65–99)
Potassium: 4.8 mmol/L (ref 3.5–5.3)
Sodium: 140 mmol/L (ref 135–146)
Total Bilirubin: 0.7 mg/dL (ref 0.2–1.2)
Total Protein: 6.8 g/dL (ref 6.1–8.1)
eGFR: 88 mL/min/1.73m2 (ref >=60)

## 2024-01-28 LAB — LIPID PANEL
Cholesterol: 123 mg/dL (ref <200)
HDL: 49 mg/dL — ABNORMAL LOW (ref >=50)
LDL Cholesterol (Calc): 51 mg/dL
Non-HDL Cholesterol (Calc): 74 mg/dL (ref <130)
Total CHOL/HDL Ratio: 2.5 (calc) (ref <5.0)
Triglycerides: 147 mg/dL (ref <150)

## 2024-01-28 LAB — HEMOGLOBIN A1C
Hgb A1c MFr Bld: 6.2 % — ABNORMAL HIGH (ref <5.7)
Mean Plasma Glucose: 131 mg/dL
eAG (mmol/L): 7.3 mmol/L

## 2024-01-28 LAB — MICROALBUMIN / CREATININE URINE RATIO
Creatinine, Urine: 237 mg/dL (ref 20–275)
Microalb Creat Ratio: 7 mg/g{creat} (ref <30)
Microalb, Ur: 1.7 mg/dL

## 2024-02-04 DIAGNOSIS — L720 Epidermal cyst: Secondary | ICD-10-CM | POA: Diagnosis not present

## 2024-02-19 DIAGNOSIS — H402231 Chronic angle-closure glaucoma, bilateral, mild stage: Secondary | ICD-10-CM | POA: Diagnosis not present

## 2024-03-09 DIAGNOSIS — L720 Epidermal cyst: Secondary | ICD-10-CM | POA: Diagnosis not present

## 2024-03-23 DIAGNOSIS — L720 Epidermal cyst: Secondary | ICD-10-CM | POA: Diagnosis not present

## 2024-04-06 ENCOUNTER — Encounter: Payer: Self-pay | Admitting: Family Medicine

## 2024-04-06 ENCOUNTER — Other Ambulatory Visit: Payer: Self-pay

## 2024-04-06 MED ORDER — METFORMIN HCL 500 MG PO TABS: ORAL_TABLET | ORAL | 5 refills | Status: AC

## 2024-05-11 ENCOUNTER — Encounter: Payer: Self-pay | Admitting: Family Medicine

## 2024-05-11 ENCOUNTER — Ambulatory Visit: Admitting: Family Medicine

## 2024-05-11 VITALS — BP 122/78 | HR 74 | Temp 98.8°F | Ht 60.0 in | Wt 145.1 lb

## 2024-05-11 DIAGNOSIS — E782 Mixed hyperlipidemia: Secondary | ICD-10-CM

## 2024-05-11 DIAGNOSIS — Z7984 Long term (current) use of oral hypoglycemic drugs: Secondary | ICD-10-CM

## 2024-05-11 DIAGNOSIS — I10 Essential (primary) hypertension: Secondary | ICD-10-CM | POA: Diagnosis not present

## 2024-05-11 DIAGNOSIS — E1165 Type 2 diabetes mellitus with hyperglycemia: Secondary | ICD-10-CM | POA: Diagnosis not present

## 2024-05-11 DIAGNOSIS — Z79899 Other long term (current) drug therapy: Secondary | ICD-10-CM | POA: Diagnosis not present

## 2024-05-11 DIAGNOSIS — M8580 Other specified disorders of bone density and structure, unspecified site: Secondary | ICD-10-CM | POA: Diagnosis not present

## 2024-05-11 MED ORDER — ROSUVASTATIN CALCIUM 40 MG PO TABS: 20.0000 mg | ORAL_TABLET | Freq: Every day | ORAL | 1 refills | Status: AC

## 2024-05-11 NOTE — Progress Notes (Signed)
 Patient Office Visit  Assessment & Plan:  Hypertension, unspecified type -     CBC with Differential/Platelet -     Comprehensive metabolic panel with GFR  Mixed hyperlipidemia -     Rosuvastatin  Calcium ; Take 0.5 tablets (20 mg total) by mouth daily.  Dispense: 90 tablet; Refill: 1 -     Lipid panel  Type 2 diabetes mellitus with hyperglycemia, without long-term current use of insulin (HCC) -     Hemoglobin A1c  Taking a statin medication -     Comprehensive metabolic panel with GFR  Osteopenia, unspecified location   Assessment and Plan    Essential hypertension Hypertension is managed with losartan . - Continue losartan .  Type 2 diabetes mellitus Diabetes is managed with metformin . No reported side effects. - Continue metformin .  Mixed hyperlipidemia Crestor  is used.  Chronic back pain Intermittent exacerbations managed with Voltaren gel, which provides relief. - Continue Voltaren gel as needed for pain relief.  General Health Maintenance Up to date on vaccinations and screenings.          Return in about 4 months (around 09/08/2024), or if symptoms worsen or fail to improve.   Subjective:    Patient ID: Gabrielle Doyle, female    DOB: 10-22-1940  Age: 83 y.o. MRN: [id number]  Chief Complaint  Patient presents with   Medical Management of Chronic Issues    HPI Discussed the use of AI scribe software for clinical note transcription with the patient, who gave verbal consent to proceed.  History of Present Illness        History of Present Illness Gabrielle Doyle is an 83 year old female who presents for a routine follow-up visit.  Her weight has remained stable at approximately 144 pounds, although her middle daughter believes she may have lost weight. Her appetite has decreased. She eats three meals a day but relies on frozen meals and vegetables from her daughter's garden. patient does not eat as much as before  She reports no side effects  from her cholesterol medication. She is also taking metformin  and losartan  but did not take them on the morning of the visit due to fasting. She no longer takes Lexapro , as she feels she is not depressed. She misses her husband but does not feel depressed and doing fine now.   She received her flu shot and COVID vaccine at the pharmacy and reports no issues with them. She also confirms having had a mammogram in March, with results sent to her.  She experiences chronic low back pain that worsens with rain and uses Voltaren gel for relief, which she finds helpful. The pain does not interfere with her sleep, and she estimates sleeping about seven hours per night.  She lives alone with her dog, who was given to her for her 72th birthday. Her dog often wakes her up at night, but she enjoys the companionship.  Physical Exam MEASUREMENTS: Weight- 144. CHEST: Lungs clear to auscultation bilaterally.  Results RADIOLOGY Mammogram: Normal (08/2023)  PATHOLOGY Skin excision: Benign (neck area)  Assessment and Plan Essential hypertension Hypertension is managed with losartan . - Continue losartan .  Type 2 diabetes mellitus Diabetes is managed with metformin . No reported side effects. - Continue metformin .  Mixed hyperlipidemia Crestor  is used.  Chronic back pain Intermittent exacerbations managed with Voltaren gel, which provides relief. - Continue Voltaren gel as needed for pain relief.  General Health Maintenance Up to date on vaccinations and screenings.    The ASCVD Risk  score (Arnett DK, et al., 2019) failed to calculate for the following reasons:   The 2019 ASCVD risk score is only valid for ages 62 to 34  Past Medical History:  Diagnosis Date   Allergy    Chest pain    cath clean 2013.  Presumed esophageal pain.   Diabetes mellitus, type 2 (HCC)    Dyslipidemia    Fibromyalgia    GERD (gastroesophageal reflux disease)    Hay fever    seasonal--fall esp   HTN  (hypertension), benign 04/07/2012   Hyperlipidemia 04/07/2012   Hypertension    Leg pain, bilateral    Questionable diagnosis of fibromyalgia in past (per pt); some vague leg pains, lyrica was tried and gave her side effect so this dx  consideration has really been dropped.   Osteoarthritis    hands, light--legs nonspecific--tylenol  is sufficient for her.   Osteopenia 08/2011   T score avg 2   Polymyalgia rheumatica 2000   took prednisone for about 2 yrs.   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   CARDIOVASCULAR STRESS TEST  12/2011   No ischemia; hypertensive/tachycardic response.  Normal LV  syst fxn, 88%. EF.   CHOLECYSTECTOMY  1980   COLONOSCOPY  09/2008   normal (Dr. Barry, MD),  Repeat TCS 2020.    TOTAL ABDOMINAL HYSTERECTOMY  1970s   non-malignant reasons; metromenorrhagia   Social History   Tobacco Use   Smoking status: Never   Smokeless tobacco: Never  Vaping Use   Vaping status: Never Used  Substance Use Topics   Alcohol use: Yes    Alcohol/week: 10.0 standard drinks of alcohol    Types: 10 Shots of liquor per week    Comment: fireball in the morning   Drug use: No   Family History  Problem Relation Age of Onset   Hyperlipidemia Mother    Stroke Mother    Heart disease Mother    Hypertension Mother    Diabetes Mother    Hyperlipidemia Father    Heart disease Father    Hypertension Father    Diabetes Sister    Diabetes Brother    Heart attack Brother    Diabetes Daughter    Hypertension Daughter    Hypertension Daughter    Colon cancer Neg Hx    Colon polyps Neg Hx    Esophageal cancer Neg Hx    Rectal cancer Neg Hx    Stomach  cancer Neg Hx    Allergies  Allergen Reactions   Celebrex [Celecoxib] Nausea And Vomiting    Almost had a stroke; syncope, slept all the time Almost had a stroke; syncope, slept all the time    Lisinopril Nausea And Vomiting   Lyrica [Pregabalin] Other (See Comments)    Hyponatremia    ROS    Objective:    BP 122/78   Pulse 74   Temp 98.8 F (37.1 C)   Ht 5' (1.524 m)   Wt 145 lb 2 oz (65.8 kg)   SpO2 97%   BMI 28.34 kg/m  BP Readings from Last 3 Encounters:  05/11/24 122/78  01/27/24 124/68  11/18/23 120/60   Wt Readings from Last  3 Encounters:  05/11/24 145 lb 2 oz (65.8 kg)  01/27/24 142 lb (64.4 kg)  11/18/23 143 lb 2 oz (64.9 kg)    Physical Exam Vitals and nursing note reviewed.  Constitutional:      General: She is not in acute distress.    Appearance: Normal appearance.  HENT:     Head: Normocephalic.     Right Ear: Tympanic membrane, ear canal and external ear normal.     Left Ear: Tympanic membrane, ear canal and external ear normal.  Eyes:     Extraocular Movements: Extraocular movements intact.     Pupils: Pupils are equal, round, and reactive to light.  Cardiovascular:     Rate and Rhythm: Normal rate and regular rhythm.     Heart sounds: Normal heart sounds.  Pulmonary:     Effort: Pulmonary effort is normal.     Breath sounds: Normal breath sounds. No wheezing.  Musculoskeletal:     Lumbar back: Tenderness present.     Right lower leg: No edema.     Left lower leg: No edema.  Neurological:     General: No focal deficit present.     Mental Status: She is alert and oriented to person, place, and time.  Psychiatric:        Mood and Affect: Mood normal.        Behavior: Behavior normal.        Thought Content: Thought content normal.        Judgment: Judgment normal.      No results found for any visits on 05/11/24.

## 2024-05-12 ENCOUNTER — Ambulatory Visit: Payer: Self-pay | Admitting: Family Medicine

## 2024-05-12 LAB — CBC WITH DIFFERENTIAL/PLATELET
Absolute Lymphocytes: 2627 {cells}/uL (ref 850–3900)
Absolute Monocytes: 508 {cells}/uL (ref 200–950)
Basophils Absolute: 40 {cells}/uL (ref 0–200)
Basophils Relative: 0.6 %
Eosinophils Absolute: 191 {cells}/uL (ref 15–500)
Eosinophils Relative: 2.9 %
HCT: 38.8 % (ref 35.0–45.0)
Hemoglobin: 12.8 g/dL (ref 11.7–15.5)
MCH: 31.9 pg (ref 27.0–33.0)
MCHC: 33 g/dL (ref 32.0–36.0)
MCV: 96.8 fL (ref 80.0–100.0)
MPV: 10.3 fL (ref 7.5–12.5)
Monocytes Relative: 7.7 %
Neutro Abs: 3234 {cells}/uL (ref 1500–7800)
Neutrophils Relative %: 49 %
Platelets: 154 Thousand/uL (ref 140–400)
RBC: 4.01 Million/uL (ref 3.80–5.10)
RDW: 12.2 % (ref 11.0–15.0)
Total Lymphocyte: 39.8 %
WBC: 6.6 Thousand/uL (ref 3.8–10.8)

## 2024-05-12 LAB — COMPREHENSIVE METABOLIC PANEL WITH GFR
AG Ratio: 1.7 (calc) (ref 1.0–2.5)
ALT: 14 U/L (ref 6–29)
AST: 24 U/L (ref 10–35)
Albumin: 4 g/dL (ref 3.6–5.1)
Alkaline phosphatase (APISO): 28 U/L — ABNORMAL LOW (ref 37–153)
BUN/Creatinine Ratio: 12 (calc) (ref 6–22)
BUN: 7 mg/dL (ref 7–25)
CO2: 30 mmol/L (ref 20–32)
Calcium: 9.5 mg/dL (ref 8.6–10.4)
Chloride: 102 mmol/L (ref 98–110)
Creat: 0.59 mg/dL — ABNORMAL LOW (ref 0.60–0.95)
Globulin: 2.4 g/dL (ref 1.9–3.7)
Glucose, Bld: 112 mg/dL — ABNORMAL HIGH (ref 65–99)
Potassium: 4.3 mmol/L (ref 3.5–5.3)
Sodium: 139 mmol/L (ref 135–146)
Total Bilirubin: 0.9 mg/dL (ref 0.2–1.2)
Total Protein: 6.4 g/dL (ref 6.1–8.1)
eGFR: 90 mL/min/1.73m2 (ref >=60)

## 2024-05-12 LAB — LIPID PANEL
Cholesterol: 109 mg/dL (ref <200)
HDL: 47 mg/dL — ABNORMAL LOW (ref >=50)
LDL Cholesterol (Calc): 40 mg/dL
Non-HDL Cholesterol (Calc): 62 mg/dL (ref <130)
Total CHOL/HDL Ratio: 2.3 (calc) (ref <5.0)
Triglycerides: 138 mg/dL (ref <150)

## 2024-05-12 LAB — HEMOGLOBIN A1C
Hgb A1c MFr Bld: 6.1 % — ABNORMAL HIGH (ref <5.7)
Mean Plasma Glucose: 128 mg/dL
eAG (mmol/L): 7.1 mmol/L

## 2024-07-05 ENCOUNTER — Other Ambulatory Visit: Payer: Self-pay | Admitting: Family Medicine

## 2024-09-08 ENCOUNTER — Ambulatory Visit: Admitting: Family Medicine
